# Patient Record
Sex: Male | Born: 1945 | Race: White | Hispanic: No | Marital: Married | Smoking: Never smoker
Health system: Southern US, Community
[De-identification: ages and names within clinical notes are randomized; demographics above are authoritative.]

## PROBLEM LIST (undated history)

## (undated) DIAGNOSIS — E785 Hyperlipidemia, unspecified: Secondary | ICD-10-CM

## (undated) DIAGNOSIS — E119 Type 2 diabetes mellitus without complications: Secondary | ICD-10-CM

## (undated) HISTORY — PX: CARDIAC VALVE REPLACEMENT: SHX585

## (undated) HISTORY — PX: CHOLECYSTECTOMY: SHX55

## (undated) HISTORY — DX: Type 2 diabetes mellitus without complications: E11.9

## (undated) HISTORY — DX: Hyperlipidemia, unspecified: E78.5

---

## 2018-11-27 ENCOUNTER — Ambulatory Visit (INDEPENDENT_AMBULATORY_CARE_PROVIDER_SITE_OTHER): Payer: Medicare PPO | Admitting: Orthopaedic Surgery

## 2018-11-27 ENCOUNTER — Ambulatory Visit (INDEPENDENT_AMBULATORY_CARE_PROVIDER_SITE_OTHER): Payer: Medicare PPO

## 2018-11-27 DIAGNOSIS — M5126 Other intervertebral disc displacement, lumbar region: Secondary | ICD-10-CM | POA: Diagnosis not present

## 2018-11-27 NOTE — Progress Notes (Signed)
Office Visit Note   Patient: Jeremy Stephens           Date of Birth: [birth year]           MRN: [id number] Visit Date: 11/27/2018              Requested by: No referring provider defined for this encounter. PCP: Patient, No Pcp Per   Assessment & Plan: Visit Diagnoses:  1. HNP (herniated nucleus pulposus), lumbar     Plan: We discussed options of microdiscectomy versus epidural injection.  We will set him up for single epidural if this not effective we will proceed with microdiscectomy on the left at the L4-5 level for the extruded fragment.  Pathophysiology discussed.  Follow-Up Instructions: No follow-ups on file.   Orders:  No orders of the defined types were placed in this encounter.  No orders of the defined types were placed in this encounter.     Procedures: No procedures performed   Clinical Data: No additional findings.   Subjective: Chief Complaint  Patient presents with  . Lower Back - Pain    HPI 73 year old male referred by Dr. Layne Benton for a left L4-5 HNP with radiculopathy.  Patient states he has had pain for 8 weeks.  He is having to take Norco to handle the pain.  He is noticed weakness in his leg he is not having trouble walking has not had to use a cane or a walker.  No past history of back problems prior to this recent episode.  He denies fever chills no bowel bladder symptoms he does not have diabetes.  Non-smoker does not drink.  He used to work in [employer] for many years.  MRI scan done in [place] shows extruded disc with fragment 9 x 8 mm left L4-5 with compression.    Review of Systems 14 point review of system positive for previous valve replacement previous MI also stent.  He has some mild lower extremity edema.  He takes aspirin daily is also on Lasix Lipitor for high triglycerides.   Objective: Vital Signs: BP (!) 146/78   Pulse 69   Physical Exam Constitutional:      Appearance: He is well-developed.  HENT:     Head:  Normocephalic and atraumatic.  Eyes:     Pupils: Pupils are equal, round, and reactive to light.  Neck:     Thyroid: No thyromegaly.     Trachea: No tracheal deviation.  Cardiovascular:     Rate and Rhythm: Normal rate.  Pulmonary:     Effort: Pulmonary effort is normal.     Breath sounds: No wheezing.  Abdominal:     General: Bowel sounds are normal.     Palpations: Abdomen is soft.  Skin:    General: Skin is warm and dry.     Capillary Refill: Capillary refill takes less than 2 seconds.  Neurological:     Mental Status: He is alert and oriented to person, place, and time.  Psychiatric:        Behavior: Behavior normal.        Thought Content: Thought content normal.        Judgment: Judgment normal.     Ortho Exam patient has negative straight leg raising 90 degrees.  EHL on the left is significantly weak no weakness on the right anterior tib is one half grade weaker on the left normal on the right gastrocsoleus is strong.  He can toe walk but has problems with his  left foot anterior tib weakness with trying to heel walk on the left normal on the right.  Specialty Comments:  No specialty comments available.  Imaging: MRI scan done 11/20/2018 shows moderate facet arthropathy ligamentum thickening broad-based disc bulge with protrusion laterally into the foramina with left paracentral disc protrusion and extruded fragment inferiorly along the posterior aspect of the L5 vertebral body measuring 10 x 7 mm.  This compresses the exiting left inferior L5 nerve root.  No compression on the opposite right side.   PMFS History: Patient Active Problem List   Diagnosis Date Noted  . HNP (herniated nucleus pulposus), lumbar 11/27/2018   No past medical history on file.  No family history on file.   Social History   Occupational History  . Not on file  Tobacco Use  . Smoking status: Not on file  Substance and Sexual Activity  . Alcohol use: Not on file  . Drug use: Not on file   . Sexual activity: Not on file

## 2018-12-10 ENCOUNTER — Other Ambulatory Visit: Payer: Self-pay | Admitting: Orthopaedic Surgery

## 2018-12-10 ENCOUNTER — Telehealth: Payer: Self-pay | Admitting: Radiology

## 2018-12-10 DIAGNOSIS — M5126 Other intervertebral disc displacement, lumbar region: Secondary | ICD-10-CM

## 2018-12-10 MED ORDER — HYDROCODONE-ACETAMINOPHEN 5-325 MG PO TABS: 1.0000 | ORAL_TABLET | Freq: Two times a day (BID) | ORAL | 0 refills | Status: DC | PRN

## 2018-12-10 NOTE — Telephone Encounter (Signed)
Patient called [place] office and asked for refill of pain medication. Please advise.  Per last office note, patient was taking Norco.

## 2018-12-10 NOTE — Telephone Encounter (Signed)
I sent him in # 20 norco 5/325 one po bid prn pain. This should be last refill unless he gets surgery , [facility] thanks

## 2018-12-11 NOTE — Addendum Note (Signed)
Addended by: Meyer Cory on: 12/11/2018 08:43 AM   Modules accepted: Orders

## 2018-12-11 NOTE — Telephone Encounter (Signed)
I called patient and advised. He states that he continues to have pain  He is aware this will be last refill.

## 2018-12-11 NOTE — Telephone Encounter (Signed)
FYI  Please see below. Referral did not get entered at time of patient's last appt. Son requests as soon as possible.

## 2018-12-11 NOTE — Telephone Encounter (Signed)
I spoke with patient and son. They questioned when ESI would be done. No order entered in chart. I explained that this had not been entered yet, but I would do it now and send to Dr. Romona Curls area to get authorization and get patient scheduled. Son asks that we do this as soon as possible, as they have been waiting for injection. I advised we would do the best that we can. Explained to son that Dr. Lorin Mercy would like to see patient after ESI. Follow up appt already scheduled in [place] office on Sept. 3. Son will cancel that appt if ESI has not been completed.

## 2018-12-11 NOTE — Telephone Encounter (Signed)
Called pt and his wife answered the phone and she wanted to schedule pt appt. Our next appt was 12/26/2018. Advised wife that pt needs to get his 12/20/2018 r/s at the [place] office, pt wife understood.

## 2018-12-20 ENCOUNTER — Ambulatory Visit: Payer: Medicare PPO | Admitting: Orthopaedic Surgery

## 2018-12-26 ENCOUNTER — Ambulatory Visit (INDEPENDENT_AMBULATORY_CARE_PROVIDER_SITE_OTHER): Payer: Medicare PPO | Admitting: Physical Medicine and Rehabilitation

## 2018-12-26 ENCOUNTER — Ambulatory Visit: Payer: Self-pay

## 2018-12-26 VITALS — BP 157/84 | HR 61

## 2018-12-26 DIAGNOSIS — M5116 Intervertebral disc disorders with radiculopathy, lumbar region: Secondary | ICD-10-CM

## 2018-12-26 MED ORDER — BETAMETHASONE SOD PHOS & ACET 6 (3-3) MG/ML IJ SUSP: 12.0000 mg | Freq: Once | INTRAMUSCULAR | Status: DC

## 2018-12-26 NOTE — Progress Notes (Signed)
 .  Numeric Pain Rating Scale and Functional Assessment Average Pain 8   In the last MONTH (on 0-10 scale) has pain interfered with the following?  1. General activity like being  able to carry out your everyday physical activities such as walking, climbing stairs, carrying groceries, or moving a chair?  Rating(7)   +Driver, -BT, -Dye Allergies.  

## 2018-12-26 NOTE — Progress Notes (Signed)
   Renel Klinge - 73 y.o. male MRN [id number]  Date of birth: 1945/11/27  Office Visit Note: Visit Date: 12/26/2018 PCP: Patient, No Pcp Per Referred by: Marybelle Killings, MD  Subjective: Chief Complaint  Patient presents with  . Lower Back - Pain  . 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  He reports 10 weeks of pain but actually over the last couple of weeks or so he is really gotten much better.  He gets some pain with walking standing for a long period.  He has had no focal weakness he has had no red flag complaints.  He has been thoroughly evaluated by Dr. Lorin Mercy.  He does have an appointment coming up for reevaluation after injection.  He has had medication trials and therapy and home exercise and time.  After speaking with him at length today he is actually doing quite well he does not really wish to have an injection if he does not have to.  He reports really at this point he could continue to do his activities if he felt like he is right now.  Brief exam shows he has no focal weakness.  At this point no other complaints I think it is wise just to see how he does without the injection.  If he continues to improve I think the disc herniation is probably resolving to a degree.  If he has any symptoms that flareup he can call us and we would see him back for the injection.  I did describe the injection in detail and we talked about the safety and benefits and reasoning behind those injections.  We will see him back as needed.  ROS Otherwise per HPI.  Assessment & Plan: Visit Diagnoses: No diagnosis found.  Plan: No additional findings.   Meds & Orders: No orders of the defined types were placed in this encounter.  No orders of the defined types were placed in this encounter.   Follow-up: No follow-ups on file.   Procedures: No procedures performed  No notes on  file   Clinical History: No specialty comments available.     Objective:  VS:  HT:    WT:   BMI:     BP:(!) 157/84  HR:61bpm  TEMP: ( )  RESP:  Physical Exam  Ortho Exam Imaging: No results found.

## 2019-01-19 ENCOUNTER — Encounter: Payer: Self-pay | Admitting: Physical Medicine and Rehabilitation

## 2020-07-17 DEATH — deceased

## 2020-10-12 ENCOUNTER — Ambulatory Visit (HOSPITAL_COMMUNITY): Payer: Medicare PPO | Admitting: Hematology

## 2020-10-30 ENCOUNTER — Inpatient Hospital Stay (HOSPITAL_COMMUNITY): Payer: Medicare PPO

## 2020-10-30 ENCOUNTER — Encounter (HOSPITAL_COMMUNITY): Payer: Self-pay | Admitting: Hematology and Oncology

## 2020-10-30 ENCOUNTER — Other Ambulatory Visit: Payer: Self-pay

## 2020-10-30 ENCOUNTER — Inpatient Hospital Stay (HOSPITAL_COMMUNITY): Payer: Medicare PPO | Attending: Hematology | Admitting: Hematology and Oncology

## 2020-10-30 DIAGNOSIS — G629 Polyneuropathy, unspecified: Secondary | ICD-10-CM | POA: Insufficient documentation

## 2020-10-30 DIAGNOSIS — Z8616 Personal history of COVID-19: Secondary | ICD-10-CM | POA: Diagnosis not present

## 2020-10-30 DIAGNOSIS — C9000 Multiple myeloma not having achieved remission: Secondary | ICD-10-CM | POA: Diagnosis present

## 2020-10-30 DIAGNOSIS — D472 Monoclonal gammopathy: Secondary | ICD-10-CM

## 2020-10-30 DIAGNOSIS — N189 Chronic kidney disease, unspecified: Secondary | ICD-10-CM | POA: Diagnosis not present

## 2020-10-30 LAB — COMPREHENSIVE METABOLIC PANEL
ALT: 36 U/L (ref 0–44)
AST: 37 U/L (ref 15–41)
Albumin: 3.8 g/dL (ref 3.5–5.0)
Alkaline Phosphatase: 29 U/L — ABNORMAL LOW (ref 38–126)
Anion gap: 7 (ref 5–15)
BUN: 28 mg/dL — ABNORMAL HIGH (ref 8–23)
CO2: 27 mmol/L (ref 22–32)
Calcium: 9.2 mg/dL (ref 8.9–10.3)
Chloride: 101 mmol/L (ref 98–111)
Creatinine, Ser: 2.33 mg/dL — ABNORMAL HIGH (ref 0.61–1.24)
GFR, Estimated: 28 mL/min — ABNORMAL LOW (ref >60)
Glucose, Bld: 197 mg/dL — ABNORMAL HIGH (ref 70–99)
Potassium: 5.2 mmol/L — ABNORMAL HIGH (ref 3.5–5.1)
Sodium: 135 mmol/L (ref 135–145)
Total Bilirubin: 1.2 mg/dL (ref 0.3–1.2)
Total Protein: 7.3 g/dL (ref 6.5–8.1)

## 2020-10-30 LAB — CBC WITH DIFFERENTIAL/PLATELET
Abs Immature Granulocytes: 0.03 10*3/uL (ref 0.00–0.07)
Basophils Absolute: 0 10*3/uL (ref 0.0–0.1)
Basophils Relative: 1 %
Eosinophils Absolute: 0.3 10*3/uL (ref 0.0–0.5)
Eosinophils Relative: 4 %
HCT: 34.6 % — ABNORMAL LOW (ref 39.0–52.0)
Hemoglobin: 11.5 g/dL — ABNORMAL LOW (ref 13.0–17.0)
Immature Granulocytes: 1 %
Lymphocytes Relative: 40 %
Lymphs Abs: 2.5 10*3/uL (ref 0.7–4.0)
MCH: 36.5 pg — ABNORMAL HIGH (ref 26.0–34.0)
MCHC: 33.2 g/dL (ref 30.0–36.0)
MCV: 109.8 fL — ABNORMAL HIGH (ref 80.0–100.0)
Monocytes Absolute: 0.4 10*3/uL (ref 0.1–1.0)
Monocytes Relative: 6 %
Neutro Abs: 3.1 10*3/uL (ref 1.7–7.7)
Neutrophils Relative %: 48 %
Platelets: 169 10*3/uL (ref 150–400)
RBC: 3.15 MIL/uL — ABNORMAL LOW (ref 4.22–5.81)
RDW: 14.1 % (ref 11.5–15.5)
WBC: 6.4 10*3/uL (ref 4.0–10.5)
nRBC: 0 % (ref 0.0–0.2)

## 2020-10-30 NOTE — Progress Notes (Signed)
[facility] CONSULT NOTE  Patient Care Team: Patient, No Pcp Per (Inactive) as PCP - General (General Practice)  CHIEF COMPLAINTS/PURPOSE OF CONSULTATION:  Smoldering MM  ASSESSMENT & PLAN:   Orders Placed This Encounter  Procedures   CBC with Differential/Platelet    Standing Status:   Standing    Number of Occurrences:   22    Standing Expiration Date:   10/30/2021   Comprehensive metabolic panel    Standing Status:   Standing    Number of Occurrences:   33    Standing Expiration Date:   10/30/2021   SPEP (Serum protein electrophoresis)    Standing Status:   Future    Number of Occurrences:   1    Standing Expiration Date:   10/30/2021   Kappa/lambda light chains    Standing Status:   Future    Number of Occurrences:   1    Standing Expiration Date:   10/30/2021   IgG, IgA, IgM    Standing Status:   Future    Number of Occurrences:   1    Standing Expiration Date:   10/30/2021   Beta 2 microglobulin, serum    Standing Status:   Future    Number of Occurrences:   1    Standing Expiration Date:   10/30/2021   This 75 year old male patient with past medical history significant for 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 He serum protein electrophoresis showed a monoclonal spike of 0.4 g/dL, kappa lambda ratio varied from 3.55-4.38. He hemoglobin has ranged from 11.5 to approximately 12 g.  Rest of his white blood cell count and platelet count were normal.  His baseline creatinine is anywhere from 1.9-2.1 He had bone marrow biopsy which showed 20% plasma cells He does meet criteria for smoldering multiple myeloma given more than 10% plasma cells, according to his hematologist in [place], his neuropathy not chronic kidney disease were thought to be related to the multiple myeloma. He also had PET/CT which showed no evidence of bone lesions. Given  intermediate risk smoldering multiple myeloma, he was not recommended to start Revlimid.  We will repeat labs today.  If he continues to have similar labs without any evidence of progression, he will continue every 2 to 3 months follow-up with Dr. Delton Coombes with repeat labs.  Labs from today with baseline Hb, slight increase in creatinine, rest of the labs pending.  HISTORY OF PRESENTING ILLNESS:   Nycholas Rayner 75 y.o. 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 01/22/2020 serum protein electrophoresis showed Tri clonal peaks. 02/14/2020 free light chain ratio was 4.38. November 2021 bone marrow biopsy showed 20% plasma cells, FISH negative and cytogenetics negative. 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  Heterogeneous soft tissue mass along right gluteal cleft at level of coccyx up to 3.1 cm SUV max of 3.27.  Does not meet criteria for active myeloma by either bone marrow plasma cells or free light chain ratio and does not clearly meet crab criteria.  He has had a chronic mild anemia which has been stable and is above cutoff for active myeloma.  He has had recent worsening of  his CKD which again was thought to be related to losartan.  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  So he was diagnosed with smoldering myeloma and meets only 1 out of 3 criteria, not candidate for early treatment.  Hence close observation was recommended.  He was continued at [facility] with monthly myeloma labs.  He is transferring care given proximity of [place] to where he lives.  He is very hard of hearing, son present with him at bedside.  He denies any new health  complaints, he feels well overall, is definitely scared of transformation to multiple myeloma.  He has neuropathy since he had acute COVID and hospitalization resulting from it for almost 4 months.  Neuropathy has continued to get better since its original onset but this is probably his most bothersome complaint.  Rest of the pertinent 10 point ROS reviewed and negative.  MEDICAL HISTORY:  Past Medical History:  Diagnosis Date   Diabetes mellitus without complication ([place])    Hyperlipidemia     SURGICAL HISTORY: Past Surgical History:  Procedure Laterality Date   CARDIAC VALVE REPLACEMENT     [place]   CHOLECYSTECTOMY     [place]    SOCIAL HISTORY: Social History   Socioeconomic History   Marital status: Married    Spouse name: Not on file   Number of children: Not on file   Years of education: Not on file   Highest education level: Not on file  Occupational History   Occupation: retired  Tobacco Use   Smoking status: Never   Smokeless tobacco: Never  Vaping Use   Vaping Use: Never used  Substance and Sexual Activity   Alcohol use: Not Currently   Drug use: Not Currently   Sexual activity: Not Currently  Other Topics Concern   Not on file  Social History Narrative   Not on file   Social Determinants of Health   Financial Resource Strain: Not on file  Food Insecurity: Not on file  Transportation Needs: Not on file  Physical Activity: Not on file  Stress: Not on file  Social Connections: Not on file  Intimate Partner Violence: Not on file    FAMILY HISTORY: Family History  Problem Relation Age of Onset   Multiple myeloma Mother 68   Heart attack Father 75       died of heart attack   Heart attack Brother    COPD Brother    Stomach cancer Paternal Uncle        1970    ALLERGIES:  is allergic to niaspan [niacin er] and sulfa antibiotics.  MEDICATIONS:  Current Outpatient Medications  Medication Sig Dispense Refill   aspirin 81 MG EC tablet Take  81 mg by mouth daily.     atorvastatin (LIPITOR) 40 MG tablet  See Instructions, TAKE ONE TABLET BY MOUTH AT BEDTIME, # 90 tab, 1 Refill(s), Pharmacy: [facility]     Coenzyme Q10 100 MG capsule Take 100 mg by mouth daily.     fenofibrate 160 MG tablet Take 160 mg by mouth daily.     ferrous sulfate 325 (65 FE) MG tablet Take 325 mg by mouth 2 (two) times daily.     furosemide (LASIX) 40 MG tablet Take 40 mg by mouth 2 (two) times daily.     gabapentin (NEURONTIN) 300 MG capsule Take 300 mg by mouth 3 (three) times daily.     glipiZIDE (GLUCOTROL XL) 5 MG  24 hr tablet Take 5 mg by mouth in the morning.     Krill Oil 500 MG CAPS Take 1 tablet by mouth daily.     losartan (COZAAR) 50 MG tablet Take 50 mg by mouth at bedtime.     metoprolol succinate (TOPROL-XL) 25 MG 24 hr tablet Take 25 mg by mouth 2 (two) times daily.     mirtazapine (REMERON) 15 MG tablet  See Instructions, TAKE ONE TABLET BY MOUTH ONCE A DAY AT BEDTIME TO HELP WITH SLEEP AND MOOD, # 90 tab, 1 Refill(s), Pharmacy: [facility]     No current facility-administered medications for this visit.     PHYSICAL EXAMINATION: ECOG PERFORMANCE STATUS: 1 - Symptomatic but completely ambulatory  There were no vitals taken for this visit.  GENERAL:alert, no distress and comfortable, using assistive device. NECK: supple, thyroid normal size, non-tender, without nodularity LYMPH:  no palpable lymphadenopathy in the cervical, axillary LUNGS: clear to auscultation and percussion with normal breathing effort HEART: regular rate & rhythm and no murmurs and no lower extremity edema ABDOMEN:abdomen soft, non-tender  Musculoskeletal:no cyanosis of digits and no clubbing  PSYCH: alert & oriented x 3 with fluent speech NEURO: no focal motor/sensory deficits  LABORATORY DATA:  I have reviewed the data as listed Lab Results  Component Value Date   WBC 6.4 10/30/2020   HGB 11.5 (L) 10/30/2020   HCT 34.6 (L) 10/30/2020   MCV  109.8 (H) 10/30/2020   PLT 169 10/30/2020     Chemistry      Component Value Date/Time   NA 135 10/30/2020 1219   K 5.2 (H) 10/30/2020 1219   CL 101 10/30/2020 1219   CO2 27 10/30/2020 1219   BUN 28 (H) 10/30/2020 1219   CREATININE 2.33 (H) 10/30/2020 1219      Component Value Date/Time   CALCIUM 9.2 10/30/2020 1219   ALKPHOS 29 (L) 10/30/2020 1219   AST 37 10/30/2020 1219   ALT 36 10/30/2020 1219   BILITOT 1.2 10/30/2020 1219      RADIOGRAPHIC STUDIES: I have personally reviewed the radiological images as listed and agreed with the findings in the report. No results found.  All questions were answered. The patient knows to call the clinic with any problems, questions or concerns. I spent 60 minutes in the care of this patient including H and P, review of records, counseling and coordination of care. Reviewed outside medical records, H and P, counseled about FU recommendations for SMM, briefly discussed transformation risk to MM and multiple treatment which are currently available for management of MM.    Benay Pike, MD 10/30/2020 3:05 PM

## 2020-10-31 LAB — BETA 2 MICROGLOBULIN, SERUM: Beta-2 Microglobulin: 5.8 mg/L — ABNORMAL HIGH (ref 0.6–2.4)

## 2020-10-31 LAB — IGG, IGA, IGM
IgA: 1040 mg/dL — ABNORMAL HIGH (ref 61–437)
IgG (Immunoglobin G), Serum: 633 mg/dL (ref 603–1613)
IgM (Immunoglobulin M), Srm: 16 mg/dL (ref 15–143)

## 2020-11-02 ENCOUNTER — Other Ambulatory Visit (HOSPITAL_COMMUNITY): Payer: Self-pay

## 2020-11-02 DIAGNOSIS — D472 Monoclonal gammopathy: Secondary | ICD-10-CM

## 2020-11-02 DIAGNOSIS — C9000 Multiple myeloma not having achieved remission: Secondary | ICD-10-CM

## 2020-11-02 LAB — PROTEIN ELECTROPHORESIS, SERUM
A/G Ratio: 1.3 (ref 0.7–1.7)
Albumin ELP: 4.2 g/dL (ref 2.9–4.4)
Alpha-1-Globulin: 0.2 g/dL (ref 0.0–0.4)
Alpha-2-Globulin: 0.8 g/dL (ref 0.4–1.0)
Beta Globulin: 1 g/dL (ref 0.7–1.3)
Gamma Globulin: 1.3 g/dL (ref 0.4–1.8)
Globulin, Total: 3.3 g/dL (ref 2.2–3.9)
M-Spike, %: 0.5 g/dL — ABNORMAL HIGH
Total Protein ELP: 7.5 g/dL (ref 6.0–8.5)

## 2020-11-02 LAB — KAPPA/LAMBDA LIGHT CHAINS
Kappa free light chain: 65.5 mg/L — ABNORMAL HIGH (ref 3.3–19.4)
Kappa, lambda light chain ratio: 4.78 — ABNORMAL HIGH (ref 0.26–1.65)
Lambda free light chains: 13.7 mg/L (ref 5.7–26.3)

## 2021-01-04 ENCOUNTER — Inpatient Hospital Stay (HOSPITAL_COMMUNITY): Payer: Medicare PPO | Attending: Hematology

## 2021-01-04 ENCOUNTER — Other Ambulatory Visit: Payer: Self-pay

## 2021-01-04 DIAGNOSIS — C9000 Multiple myeloma not having achieved remission: Secondary | ICD-10-CM | POA: Insufficient documentation

## 2021-01-04 DIAGNOSIS — Z801 Family history of malignant neoplasm of trachea, bronchus and lung: Secondary | ICD-10-CM | POA: Diagnosis not present

## 2021-01-04 DIAGNOSIS — Z8616 Personal history of COVID-19: Secondary | ICD-10-CM | POA: Insufficient documentation

## 2021-01-04 DIAGNOSIS — D472 Monoclonal gammopathy: Secondary | ICD-10-CM

## 2021-01-04 DIAGNOSIS — Z806 Family history of leukemia: Secondary | ICD-10-CM | POA: Diagnosis not present

## 2021-01-04 DIAGNOSIS — Z800 Family history of malignant neoplasm of digestive organs: Secondary | ICD-10-CM | POA: Diagnosis not present

## 2021-01-04 LAB — CBC WITH DIFFERENTIAL/PLATELET
Abs Immature Granulocytes: 0.02 10*3/uL (ref 0.00–0.07)
Basophils Absolute: 0 10*3/uL (ref 0.0–0.1)
Basophils Relative: 1 %
Eosinophils Absolute: 0.2 10*3/uL (ref 0.0–0.5)
Eosinophils Relative: 4 %
HCT: 34.7 % — ABNORMAL LOW (ref 39.0–52.0)
Hemoglobin: 11.5 g/dL — ABNORMAL LOW (ref 13.0–17.0)
Immature Granulocytes: 0 %
Lymphocytes Relative: 39 %
Lymphs Abs: 2.1 10*3/uL (ref 0.7–4.0)
MCH: 36.1 pg — ABNORMAL HIGH (ref 26.0–34.0)
MCHC: 33.1 g/dL (ref 30.0–36.0)
MCV: 108.8 fL — ABNORMAL HIGH (ref 80.0–100.0)
Monocytes Absolute: 0.3 10*3/uL (ref 0.1–1.0)
Monocytes Relative: 6 %
Neutro Abs: 2.8 10*3/uL (ref 1.7–7.7)
Neutrophils Relative %: 50 %
Platelets: 165 10*3/uL (ref 150–400)
RBC: 3.19 MIL/uL — ABNORMAL LOW (ref 4.22–5.81)
RDW: 13.9 % (ref 11.5–15.5)
WBC: 5.4 10*3/uL (ref 4.0–10.5)
nRBC: 0 % (ref 0.0–0.2)

## 2021-01-04 LAB — COMPREHENSIVE METABOLIC PANEL
ALT: 34 U/L (ref 0–44)
AST: 33 U/L (ref 15–41)
Albumin: 3.9 g/dL (ref 3.5–5.0)
Alkaline Phosphatase: 37 U/L — ABNORMAL LOW (ref 38–126)
Anion gap: 8 (ref 5–15)
BUN: 21 mg/dL (ref 8–23)
CO2: 29 mmol/L (ref 22–32)
Calcium: 9 mg/dL (ref 8.9–10.3)
Chloride: 99 mmol/L (ref 98–111)
Creatinine, Ser: 1.94 mg/dL — ABNORMAL HIGH (ref 0.61–1.24)
GFR, Estimated: 35 mL/min — ABNORMAL LOW (ref >60)
Glucose, Bld: 278 mg/dL — ABNORMAL HIGH (ref 70–99)
Potassium: 4.5 mmol/L (ref 3.5–5.1)
Sodium: 136 mmol/L (ref 135–145)
Total Bilirubin: 0.9 mg/dL (ref 0.3–1.2)
Total Protein: 7.1 g/dL (ref 6.5–8.1)

## 2021-01-04 LAB — LACTATE DEHYDROGENASE: LDH: 154 U/L (ref 98–192)

## 2021-01-05 LAB — KAPPA/LAMBDA LIGHT CHAINS
Kappa free light chain: 60.4 mg/L — ABNORMAL HIGH (ref 3.3–19.4)
Kappa, lambda light chain ratio: 3.55 — ABNORMAL HIGH (ref 0.26–1.65)
Lambda free light chains: 17 mg/L (ref 5.7–26.3)

## 2021-01-05 LAB — PROTEIN ELECTROPHORESIS, SERUM
A/G Ratio: 1.1 (ref 0.7–1.7)
Albumin ELP: 3.5 g/dL (ref 2.9–4.4)
Alpha-1-Globulin: 0.2 g/dL (ref 0.0–0.4)
Alpha-2-Globulin: 0.8 g/dL (ref 0.4–1.0)
Beta Globulin: 1 g/dL (ref 0.7–1.3)
Gamma Globulin: 1.2 g/dL (ref 0.4–1.8)
Globulin, Total: 3.3 g/dL (ref 2.2–3.9)
M-Spike, %: 0.3 g/dL — ABNORMAL HIGH
Total Protein ELP: 6.8 g/dL (ref 6.0–8.5)

## 2021-01-10 NOTE — Progress Notes (Signed)
Jeremy Stephens, [place] [address]   CLINIC:  Medical Oncology/Hematology  PCP:  Patient, No Pcp Per (Inactive) None None   REASON FOR VISIT:  Follow-up for IgA kappa smoldering multiple myeloma  PRIOR THERAPY: none  NGS Results: not done  CURRENT THERAPY: surveillance  INTERVAL HISTORY:  [name], a 75 y.o. male, returns for routine follow-up of his IgA kappa smoldering multiple myeloma. Jeremy Stephens was last seen on 10/30/2020 with Dr. Benay Pike.   Today he reports feeling well. He lives in [place], [place]. He had a Covid infection in starting November 2020 for which he was hospitalized for 4 months. Following this infection he reports continued tingling in his feet bilaterally. His mother had multiple myeloma, and his brother had lung cancer and was a smoker. A paternal uncle had stomach cancer and chewed tobacco. Prior to retirement he worked in [employer], and he denies excessive or unusual chemical exposure.    REVIEW OF SYSTEMS:  Review of Systems  Constitutional:  Positive for fatigue (40%). Negative for appetite change.  Neurological:  Positive for numbness (tingling feet).  All other systems reviewed and are negative.  PAST MEDICAL/SURGICAL HISTORY:  Past Medical History:  Diagnosis Date   Diabetes mellitus without complication ([place])    Hyperlipidemia    Past Surgical History:  Procedure Laterality Date   CARDIAC VALVE REPLACEMENT     [place]   CHOLECYSTECTOMY     [place]    SOCIAL HISTORY:  Social History   Socioeconomic History   Marital status: Married    Spouse name: Not on file   Number of children: Not on file   Years of education: Not on file   Highest education level: Not on file  Occupational History   Occupation: retired  Tobacco Use   Smoking status: Never   Smokeless tobacco: Never  Vaping Use   Vaping Use: Never used  Substance and Sexual Activity   Alcohol use: Not Currently    Drug use: Not Currently   Sexual activity: Not Currently  Other Topics Concern   Not on file  Social History Narrative   Not on file   Social Determinants of Health   Financial Resource Strain: Not on file  Food Insecurity: Not on file  Transportation Needs: Not on file  Physical Activity: Not on file  Stress: Not on file  Social Connections: Not on file  Intimate Partner Violence: Not on file    FAMILY HISTORY:  Family History  Problem Relation Age of Onset   Multiple myeloma Mother 24   Heart attack Father 61       died of heart attack   Heart attack Brother    COPD Brother    Stomach cancer Paternal Uncle        1970    CURRENT MEDICATIONS:  Current Outpatient Medications  Medication Sig Dispense Refill   aspirin 81 MG EC tablet Take 81 mg by mouth daily.     atorvastatin (LIPITOR) 40 MG tablet  See Instructions, TAKE ONE TABLET BY MOUTH AT BEDTIME, # 90 tab, 1 Refill(s), Pharmacy: [facility]     Coenzyme Q10 100 MG capsule Take 100 mg by mouth daily.     fenofibrate 160 MG tablet Take 160 mg by mouth daily.     ferrous sulfate 325 (65 FE) MG tablet Take 325 mg by mouth 2 (two) times daily.     furosemide (LASIX) 40 MG tablet Take 40 mg by  mouth 2 (two) times daily.     gabapentin (NEURONTIN) 300 MG capsule Take 300 mg by mouth 3 (three) times daily.     glipiZIDE (GLUCOTROL XL) 5 MG 24 hr tablet Take 5 mg by mouth in the morning.     Krill Oil 500 MG CAPS Take 1 tablet by mouth daily.     losartan (COZAAR) 50 MG tablet Take 50 mg by mouth at bedtime.     metoprolol succinate (TOPROL-XL) 25 MG 24 hr tablet Take 25 mg by mouth 2 (two) times daily.     mirtazapine (REMERON) 15 MG tablet  See Instructions, TAKE ONE TABLET BY MOUTH ONCE A DAY AT BEDTIME TO HELP WITH SLEEP AND MOOD, # 90 tab, 1 Refill(s), Pharmacy: [facility]     No current facility-administered medications for this visit.    ALLERGIES:  Allergies  Allergen Reactions   Niaspan  [Niacin Er]    Sulfa Antibiotics     PHYSICAL EXAM:  Performance status (ECOG): 1 - Symptomatic but completely ambulatory  There were no vitals filed for this visit. Wt Readings from Last 3 Encounters:  No data found for Wt   Physical Exam Vitals reviewed.  Constitutional:      Appearance: Normal appearance.  Cardiovascular:     Rate and Rhythm: Normal rate and regular rhythm.     Pulses: Normal pulses.     Heart sounds: Normal heart sounds.  Pulmonary:     Effort: Pulmonary effort is normal.     Breath sounds: Normal breath sounds.  Neurological:     General: No focal deficit present.     Mental Status: He is alert and oriented to person, place, and time.  Psychiatric:        Mood and Affect: Mood normal.        Behavior: Behavior normal.     LABORATORY DATA:  I have reviewed the labs as listed.  CBC Latest Ref Rng & Units 01/04/2021 10/30/2020  WBC 4.0 - 10.5 K/uL 5.4 6.4  Hemoglobin 13.0 - 17.0 g/dL 11.5(L) 11.5(L)  Hematocrit 39.0 - 52.0 % 34.7(L) 34.6(L)  Platelets 150 - 400 K/uL 165 169   CMP Latest Ref Rng & Units 01/04/2021 10/30/2020  Glucose 70 - 99 mg/dL 278(H) 197(H)  BUN 8 - 23 mg/dL 21 28(H)  Creatinine 0.61 - 1.24 mg/dL 1.94(H) 2.33(H)  Sodium 135 - 145 mmol/L 136 135  Potassium 3.5 - 5.1 mmol/L 4.5 5.2(H)  Chloride 98 - 111 mmol/L 99 101  CO2 22 - 32 mmol/L 29 27  Calcium 8.9 - 10.3 mg/dL 9.0 9.2  Total Protein 6.5 - 8.1 g/dL 7.1 7.3  Total Bilirubin 0.3 - 1.2 mg/dL 0.9 1.2  Alkaline Phos 38 - 126 U/L 37(L) 29(L)  AST 15 - 41 U/L 33 37  ALT 0 - 44 U/L 34 36    DIAGNOSTIC IMAGING:  I have independently reviewed the scans and discussed with the patient. No results found.   ASSESSMENT:  IgA kappa smoldering multiple myeloma: - Diagnosed in November 2021 at [facility] hematology oncology. - Bone marrow biopsy with 20% plasma cells.  PET scan was also negative for any lytic lesions. - He had COVID infection in November 2020.  Since then he has been  using walker.  2.  Social/family history: - He worked in [employer] and no exposure to chemicals was reported. - Mother died of multiple myeloma.  Brother had lung cancer.  Paternal uncle had stomach cancer.   PLAN:  IgA  kappa smoldering multiple myeloma: - He does not report any new onset pains.  No recent infections. - Reviewed labs from 01/04/2021.  Creatinine is 1.94 and calcium is 9.0. - SPEP showed M spike of 0.3 g.  Hemoglobin is 11.5 with MCV of 108. - Free kappa light chains are elevated at 60, lambda light chains 17, ratio increased at 3.55. - We discussed normal pathophysiology and follow-ups of smoldering myeloma. - Recommend follow-up in 4 months with repeat labs.  We will also consider intermittent skeletal survey.   Orders placed this encounter:  No orders of the defined types were placed in this encounter.    Derek Jack, MD [place] [phone]   I, Thana Ates, am acting as a scribe for Dr. Derek Jack.  I, Derek Jack MD, have reviewed the above documentation for accuracy and completeness, and I agree with the above.

## 2021-01-11 ENCOUNTER — Other Ambulatory Visit: Payer: Self-pay

## 2021-01-11 ENCOUNTER — Inpatient Hospital Stay (HOSPITAL_COMMUNITY): Payer: Medicare PPO | Admitting: Hematology

## 2021-01-11 VITALS — BP 123/65 | HR 83 | Temp 97.0°F | Resp 21 | Wt 221.4 lb

## 2021-01-11 DIAGNOSIS — D472 Monoclonal gammopathy: Secondary | ICD-10-CM

## 2021-01-11 DIAGNOSIS — C9000 Multiple myeloma not having achieved remission: Secondary | ICD-10-CM

## 2021-01-11 NOTE — Patient Instructions (Addendum)
[facility] °Discharge Instructions ° °You were seen today by Dr. Katragadda. He went over your recent results. Dr. Katragadda will see you back in 4 months for labs and follow up. ° ° °Thank you for choosing [facility] at [facility] to provide your oncology and hematology care.  To afford each patient quality time with our provider, please arrive at least 15 minutes before your scheduled appointment time.  ° °If you have a lab appointment with the Cancer Center please come in thru the Main Entrance and check in at the main information desk ° °You need to re-schedule your appointment should you arrive 10 or more minutes late.  We strive to give you quality time with our providers, and arriving late affects you and other patients whose appointments are after yours.  Also, if you no show three or more times for appointments you may be dismissed from the clinic at the providers discretion.     °Again, thank you for choosing [facility].  Our hope is that these requests will decrease the amount of time that you wait before being seen by our physicians.       °_____________________________________________________________ ° °Should you have questions after your visit to [facility], please contact our office at [phone] between the hours of 8:00 a.m. and 4:30 p.m.  Voicemails left after 4:00 p.m. will not be returned until the following business day.  For prescription refill requests, have your pharmacy contact our office and allow 72 hours.   ° °Cancer Center Support Programs:  ° °> Cancer Support Group  °2nd Tuesday of the month 1pm-2pm, Journey Room  ° ° °

## 2021-03-19 ENCOUNTER — Emergency Department (HOSPITAL_COMMUNITY): Payer: Medicare PPO

## 2021-03-19 ENCOUNTER — Emergency Department (HOSPITAL_COMMUNITY)
Admission: EM | Admit: 2021-03-19 | Discharge: 2021-03-19 | Disposition: A | Discharge status: Home / Self Care | Payer: Medicare PPO | Attending: Emergency Medicine | Admitting: Emergency Medicine

## 2021-03-19 ENCOUNTER — Encounter (HOSPITAL_COMMUNITY): Payer: Self-pay | Admitting: *Deleted

## 2021-03-19 DIAGNOSIS — Z7982 Long term (current) use of aspirin: Secondary | ICD-10-CM | POA: Insufficient documentation

## 2021-03-19 DIAGNOSIS — G8929 Other chronic pain: Secondary | ICD-10-CM | POA: Insufficient documentation

## 2021-03-19 DIAGNOSIS — Z954 Presence of other heart-valve replacement: Secondary | ICD-10-CM | POA: Insufficient documentation

## 2021-03-19 DIAGNOSIS — R944 Abnormal results of kidney function studies: Secondary | ICD-10-CM | POA: Insufficient documentation

## 2021-03-19 DIAGNOSIS — Z8579 Personal history of other malignant neoplasms of lymphoid, hematopoietic and related tissues: Secondary | ICD-10-CM | POA: Insufficient documentation

## 2021-03-19 DIAGNOSIS — Z79899 Other long term (current) drug therapy: Secondary | ICD-10-CM | POA: Insufficient documentation

## 2021-03-19 DIAGNOSIS — M5442 Lumbago with sciatica, left side: Secondary | ICD-10-CM | POA: Diagnosis not present

## 2021-03-19 DIAGNOSIS — E119 Type 2 diabetes mellitus without complications: Secondary | ICD-10-CM | POA: Diagnosis not present

## 2021-03-19 DIAGNOSIS — M545 Low back pain, unspecified: Secondary | ICD-10-CM | POA: Diagnosis present

## 2021-03-19 LAB — BASIC METABOLIC PANEL
Anion gap: 11 (ref 5–15)
BUN: 67 mg/dL — ABNORMAL HIGH (ref 8–23)
CO2: 26 mmol/L (ref 22–32)
Calcium: 9 mg/dL (ref 8.9–10.3)
Chloride: 100 mmol/L (ref 98–111)
Creatinine, Ser: 3.1 mg/dL — ABNORMAL HIGH (ref 0.61–1.24)
GFR, Estimated: 20 mL/min — ABNORMAL LOW (ref >60)
Glucose, Bld: 282 mg/dL — ABNORMAL HIGH (ref 70–99)
Potassium: 4.9 mmol/L (ref 3.5–5.1)
Sodium: 137 mmol/L (ref 135–145)

## 2021-03-19 LAB — URINALYSIS, ROUTINE W REFLEX MICROSCOPIC
Bilirubin Urine: NEGATIVE
Glucose, UA: 100 mg/dL — AB
Hgb urine dipstick: NEGATIVE
Ketones, ur: NEGATIVE mg/dL
Leukocytes,Ua: NEGATIVE
Nitrite: NEGATIVE
Protein, ur: NEGATIVE mg/dL
Specific Gravity, Urine: 1.015 (ref 1.005–1.030)
pH: 5.5 (ref 5.0–8.0)

## 2021-03-19 LAB — I-STAT CHEM 8, ED
BUN: 46 mg/dL — ABNORMAL HIGH (ref 8–23)
Calcium, Ion: 0.96 mmol/L — ABNORMAL LOW (ref 1.15–1.40)
Chloride: 111 mmol/L (ref 98–111)
Creatinine, Ser: 2.3 mg/dL — ABNORMAL HIGH (ref 0.61–1.24)
Glucose, Bld: 164 mg/dL — ABNORMAL HIGH (ref 70–99)
HCT: 28 % — ABNORMAL LOW (ref 39.0–52.0)
Hemoglobin: 9.5 g/dL — ABNORMAL LOW (ref 13.0–17.0)
Potassium: 3.4 mmol/L — ABNORMAL LOW (ref 3.5–5.1)
Sodium: 144 mmol/L (ref 135–145)
TCO2: 20 mmol/L — ABNORMAL LOW (ref 22–32)

## 2021-03-19 LAB — CBC WITH DIFFERENTIAL/PLATELET
Abs Immature Granulocytes: 0.05 10*3/uL (ref 0.00–0.07)
Basophils Absolute: 0 10*3/uL (ref 0.0–0.1)
Basophils Relative: 0 %
Eosinophils Absolute: 0 10*3/uL (ref 0.0–0.5)
Eosinophils Relative: 0 %
HCT: 37.4 % — ABNORMAL LOW (ref 39.0–52.0)
Hemoglobin: 12.1 g/dL — ABNORMAL LOW (ref 13.0–17.0)
Immature Granulocytes: 1 %
Lymphocytes Relative: 21 %
Lymphs Abs: 1.2 10*3/uL (ref 0.7–4.0)
MCH: 35 pg — ABNORMAL HIGH (ref 26.0–34.0)
MCHC: 32.4 g/dL (ref 30.0–36.0)
MCV: 108.1 fL — ABNORMAL HIGH (ref 80.0–100.0)
Monocytes Absolute: 0.2 10*3/uL (ref 0.1–1.0)
Monocytes Relative: 3 %
Neutro Abs: 4.4 10*3/uL (ref 1.7–7.7)
Neutrophils Relative %: 75 %
Platelets: 171 10*3/uL (ref 150–400)
RBC: 3.46 MIL/uL — ABNORMAL LOW (ref 4.22–5.81)
RDW: 14.2 % (ref 11.5–15.5)
WBC: 5.9 10*3/uL (ref 4.0–10.5)
nRBC: 0 % (ref 0.0–0.2)

## 2021-03-19 MED ORDER — HYDROCODONE-ACETAMINOPHEN 5-325 MG PO TABS
1.0000 | ORAL_TABLET | Freq: Once | ORAL | Status: AC
  Administered 2021-03-19: 1 via ORAL
  Filled 2021-03-19: qty 1

## 2021-03-19 MED ORDER — SODIUM CHLORIDE 0.9 % IV BOLUS
500.0000 mL | Freq: Once | INTRAVENOUS | Status: AC
  Administered 2021-03-19: 500 mL via INTRAVENOUS

## 2021-03-19 MED ORDER — FENTANYL CITRATE PF 50 MCG/ML IJ SOSY: 25.0000 ug | PREFILLED_SYRINGE | Freq: Once | INTRAMUSCULAR | Status: DC

## 2021-03-19 NOTE — Discharge Instructions (Addendum)
Your MRI today shows that you have a herniated disc in your lower back.  This is the likely source of your back and leg pain.  I recommend that you continue taking your prednisone as directed.  If your oxycodone is too sedating, you may try taking 1/2 tablet every 6 hours.  Follow-up with your orthopedic provider or with Dr. Romona Curls office.

## 2021-03-19 NOTE — ED Provider Notes (Signed)
[facility] EMERGENCY DEPARTMENT Provider Note   CSN: [id number] Arrival date & time: 03/19/21  1048     History Chief Complaint  Patient presents with   Back Pain    Jeremy Stephens is a 75 y.o. male.   Back Pain Associated symptoms: no abdominal pain, no dysuria, no fever, no headaches, no numbness and no weakness        Jeremy Stephens is a 75 y.o. male with past medical history for hyperlipidemia and type 2 diabetes who presents to the Emergency Department complaining of pain of his left lower back for greater than 1 week.  Patient reports history of sciatica.  Has been seeing orthopedics in [place].  Was recently switched from hydrocodone to oxycodone for pain.  Patient's son who is also at bedside and provides some history.  Son states that his father has been somewhat confused since starting the oxycodone.  Patient describes constant throbbing pain from his left back and buttock that radiates into his left leg.  Pain is associated with standing and walking.  Pain improves at rest.  Patient's sons states that he had similar symptoms in the past and was referred to orthopedics for a epidural but pain improved and patient canceled the appointment.  Patient states current pain is similar to previous, but more severe.  He denies recent fall or injury, fever or chills, abdominal pain, urine or bowel changes, numbness or weakness of the lower extremities.    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    Past Surgical History:  Procedure Laterality Date   CARDIAC VALVE REPLACEMENT     [place]   CHOLECYSTECTOMY     [place]       Family History  Problem Relation Age of Onset   Multiple myeloma Mother 43   Heart attack Father 61       died of heart attack    Heart attack Brother    COPD Brother    Stomach cancer Paternal Uncle        1970    Social History   Tobacco Use   Smoking status: Never   Smokeless tobacco: Never  Vaping Use   Vaping Use: Never used  Substance Use Topics   Alcohol use: Not Currently   Drug use: Not Currently    Home Medications Prior to Admission medications   Medication Sig Start Date End Date Taking? Authorizing Provider  aspirin 81 MG EC tablet Take 81 mg by mouth daily. 07/11/19  Yes [provider]  atorvastatin (LIPITOR) 40 MG tablet  See Instructions, TAKE ONE TABLET BY MOUTH AT BEDTIME, # 90 tab, 1 Refill(s), Pharmacy: [facility] 12/05/19  Yes [provider]  Cholecalciferol (VITAMIN D3) 1.25 MG (50000 UT) CAPS Take 1 capsule by mouth once a week. Takes on Saturday 02/20/21  Yes [provider]  Coenzyme Q10 100 MG capsule Take 100 mg by mouth daily. 12/13/19  Yes [provider]  fenofibrate 160 MG tablet Take 160 mg by mouth daily. 11/01/18  Yes [provider]  ferrous sulfate 325 (65 FE) MG tablet Take 325 mg by mouth 2 (two) times daily. 12/13/19  Yes [provider]  furosemide (LASIX) 40 MG tablet Take 40 mg by mouth 2 (two) times daily. 10/17/19  Yes [provider]  gabapentin (NEURONTIN) 300 MG capsule Take 300 mg by mouth 3 (three) times daily. 11/19/18  Yes [provider]  glipiZIDE (GLUCOTROL XL) 5 MG 24 hr tablet Take 5 mg by mouth 2 (two) times daily with a meal. 10/18/19  Yes [provider]  Javier Docker Oil 500 MG CAPS Take 1 tablet by mouth daily.   Yes [provider]  losartan (COZAAR) 50 MG tablet Take 50 mg by mouth at bedtime. 03/03/20  Yes [provider]  metoprolol succinate (TOPROL-XL) 25 MG 24 hr tablet Take 25 mg by mouth 2 (two) times daily. 12/18/19  Yes [provider]  mirtazapine (REMERON) 15 MG tablet  See Instructions, TAKE ONE TABLET BY MOUTH ONCE A DAY AT BEDTIME TO HELP  WITH SLEEP AND MOOD, # 90 tab, 1 Refill(s), Pharmacy: [facility] 12/05/19  Yes [provider]  Multiple Vitamin (MULTIVITAMIN WITH MINERALS) TABS tablet Take 1 tablet by mouth daily.   Yes [provider]  oxyCODONE-acetaminophen (PERCOCET/ROXICET) 5-325 MG tablet Take 1 tablet by mouth 2 (two) times daily as needed. 03/17/21   [provider]    Allergies    Niaspan [niacin er] and Sulfa antibiotics  Review of Systems   Review of Systems  Constitutional:  Negative for appetite change and fever.  Respiratory:  Negative for shortness of breath.   Gastrointestinal:  Negative for abdominal pain, constipation, diarrhea, nausea and vomiting.  Genitourinary:  Negative for decreased urine volume, difficulty urinating, dysuria, flank pain and hematuria.  Musculoskeletal:  Positive for back pain. Negative for joint swelling.  Skin:  Negative for rash and wound.  Neurological:  Negative for dizziness, syncope, facial asymmetry, weakness, numbness and headaches.  Psychiatric/Behavioral:  Positive for confusion.   All other systems reviewed and are negative.  Physical Exam Updated Vital Signs BP 134/70   Pulse 65   Temp 97.7 F (36.5 C) (Oral)   Resp 17   SpO2 98%   Physical Exam Vitals and nursing note reviewed.  Constitutional:      General: He is not in acute distress.    Appearance: Normal appearance. He is well-developed. He is not toxic-appearing.  HENT:     Head: Atraumatic.     Mouth/Throat:     Mouth: Mucous membranes are dry.  Eyes:     Extraocular Movements: Extraocular movements intact.     Conjunctiva/sclera: Conjunctivae normal.     Pupils: Pupils are equal, round, and reactive to light.  Cardiovascular:     Rate and Rhythm: Normal rate and regular rhythm.     Pulses: Normal pulses.     Comments: DP pulses are strong and palpable bilaterally Pulmonary:     Effort: Pulmonary effort is normal. No respiratory distress.     Breath  sounds: Normal breath sounds.  Chest:     Chest wall: No tenderness.  Abdominal:     General: There is no distension.     Palpations: Abdomen is soft.     Tenderness: There is no abdominal tenderness.  Musculoskeletal:        General: Tenderness present. No swelling or signs of injury.     Cervical back: Normal range of motion and neck supple.     Lumbar back: Tenderness present. No swelling, deformity or lacerations. Normal range of motion.     Right lower leg: No edema.     Left lower leg: No edema.     Comments: Tenderness to palpation of the lower lumbar spine and  left paraspinal muscles.  Mild tenderness also noted at the left SI joint space.  Hip flexors and extensors are intact. Pt has 5/5 strength against resistance of bilateral lower extremities.     Skin:    General: Skin is warm.     Capillary Refill: Capillary refill takes less than 2 seconds.     Findings: No erythema or rash.  Neurological:     General: No focal deficit present.     Mental Status: He is alert.     Sensory: Sensation is intact. No sensory deficit.     Motor: Motor function is intact. No weakness or abnormal muscle tone.     Coordination: Coordination is intact. Coordination normal.     Deep Tendon Reflexes:     Reflex Scores:      Patellar reflexes are 2+ on the right side and 2+ on the left side.      Achilles reflexes are 2+ on the right side and 2+ on the left side.    Comments: CN II through XII grossly intact.  Speech clear.  No facial droop or pronator drift.  Patient appears alert and mentating well.    ED Results / Procedures / Treatments   Labs (all labs ordered are listed, but only abnormal results are displayed) Labs Reviewed  CBC WITH DIFFERENTIAL/PLATELET - Abnormal; Notable for the following components:      Result Value   RBC 3.46 (*)    Hemoglobin 12.1 (*)    HCT 37.4 (*)    MCV 108.1 (*)    MCH 35.0 (*)    All other components within normal limits  BASIC METABOLIC PANEL -  Abnormal; Notable for the following components:   Glucose, Bld 282 (*)    BUN 67 (*)    Creatinine, Ser 3.10 (*)    GFR, Estimated 20 (*)    All other components within normal limits  URINALYSIS, ROUTINE W REFLEX MICROSCOPIC - Abnormal; Notable for the following components:   Glucose, UA 100 (*)    All other components within normal limits  I-STAT CHEM 8, ED    EKG None  Radiology MR LUMBAR SPINE WO CONTRAST  Result Date: 03/19/2021 CLINICAL DATA:  Low back pain, progressive neurologic deficit Low back pain, increased fracture risk EXAM: MRI LUMBAR SPINE WITHOUT CONTRAST TECHNIQUE: Multiplanar, multisequence MR imaging of the lumbar spine was performed. No intravenous contrast was administered. COMPARISON:  None. FINDINGS: Motion artifact is present. Segmentation: Standard. Alignment:  Anteroposterior alignment is maintained Vertebrae: Vertebral body heights are preserved. Mild degenerative endplate irregularity at L4-L5. No substantial marrow edema. No suspicious osseous lesion. Conus medullaris and cauda equina: Conus extends to the L1 level. Conus and cauda equina appear normal. Paraspinal and other soft tissues: Unremarkable. Disc levels: L1-L2:  No canal or foraminal stenosis. L2-L3:  No canal or foraminal stenosis. L3-L4: Disc bulge with endplate osteophytic ridging. Facet arthropathy with ligamentum flavum infolding. Minor canal stenosis. Mild right foraminal stenosis. No left foraminal stenosis. L4-L5: Disc bulge with superimposed left subarticular extrusion extending just below disc level. Facet arthropathy with ligamentum flavum infolding. Minor canal stenosis. Partial effacement of subarticular recesses. Mild foraminal stenosis. Disc herniation compresses the traversing left L5 nerve roots below disc level. L5-S1: Disc bulge with endplate osteophytic ridging. Facet arthropathy with ligamentum flavum infolding. No canal stenosis. Partial effacement of the right subarticular recess. Mild  to moderate right and mild left foraminal stenosis. IMPRESSION: Multilevel degenerative changes as detailed above. Most notably, a disc herniation at  L4-L5 compresses the traversing left L5 nerve roots below disc level. Multilevel facet arthropathy. Electronically Signed   By: Macy Mis M.D.   On: 03/19/2021 14:25    Procedures Procedures   Medications Ordered in ED Medications  fentaNYL (SUBLIMAZE) injection 25 mcg (0 mcg Intravenous Hold 03/19/21 1258)  sodium chloride 0.9 % bolus 500 mL (0 mLs Intravenous Stopped 03/19/21 1651)  HYDROcodone-acetaminophen (NORCO/VICODIN) 5-325 MG per tablet 1 tablet (1 tablet Oral Given 03/19/21 1601)    ED Course  I have reviewed the triage vital signs and the nursing notes.  Pertinent labs & imaging results that were available during my care of the patient were reviewed by me and considered in my medical decision making (see chart for details).    MDM Rules/Calculators/A&P                           Patient here for acute on chronic low back pain.  Seen by orthopedics in [place] and recommended to come here for further evaluation and possible MRI per patient and his son who also provides history.  Labs interpreted by me, show elevated kidney function with BUN at 67 and creatinine elevated from baseline at 3.  Clinically, patient appears dehydrated.  I feel that his elevated creatinine is likely secondary to dehydration will give fluid bolus and oral fluid challenge and reassess.  On recheck, patient has received IV fluids tolerated oral fluids without difficulty.  Creatinine improved and now at baseline.  BUN also improved.  I feel the patient is appropriate for discharge home, he has pain medication at home.  Son indicates that his oxycodone may be causing some confusion, I have recommended to half his tablet every 6 hours if needed.  He will follow-up with orthopedics and has appointment with physiatrist.  No clinical signs for CVA or TIA or  sepsis  Final Clinical Impression(s) / ED Diagnoses Final diagnoses:  Acute left-sided low back pain with left-sided sciatica    Rx / DC Orders ED Discharge Orders     None        Kem Parkinson, PA-C 03/22/21 1503    Elnora Morrison, MD 03/22/21 2353

## 2021-03-19 NOTE — ED Triage Notes (Signed)
Back pain since Thanksgiving, states his pain medication is making him confused

## 2021-03-23 ENCOUNTER — Ambulatory Visit: Payer: Medicare PPO | Admitting: Physical Medicine and Rehabilitation

## 2021-03-23 ENCOUNTER — Ambulatory Visit: Payer: Self-pay

## 2021-03-23 ENCOUNTER — Other Ambulatory Visit: Payer: Self-pay

## 2021-03-23 ENCOUNTER — Encounter: Payer: Self-pay | Admitting: Physical Medicine and Rehabilitation

## 2021-03-23 VITALS — BP 105/64 | HR 67

## 2021-03-23 DIAGNOSIS — M47816 Spondylosis without myelopathy or radiculopathy, lumbar region: Secondary | ICD-10-CM

## 2021-03-23 DIAGNOSIS — M5416 Radiculopathy, lumbar region: Secondary | ICD-10-CM

## 2021-03-23 DIAGNOSIS — M5116 Intervertebral disc disorders with radiculopathy, lumbar region: Secondary | ICD-10-CM

## 2021-03-23 DIAGNOSIS — M4726 Other spondylosis with radiculopathy, lumbar region: Secondary | ICD-10-CM

## 2021-03-23 MED ORDER — METHYLPREDNISOLONE ACETATE 80 MG/ML IJ SUSP
80.0000 mg | Freq: Once | INTRAMUSCULAR | Status: AC
  Administered 2021-03-23: 80 mg

## 2021-03-23 NOTE — Progress Notes (Signed)
Jeremy Stephens - 75 y.o. male MRN [id number]  Date of birth: [birth year]  Office Visit Note: Visit Date: 03/23/2021 PCP: Tempie Hoist, FNP Referred by: Tempie Hoist, FNP  Subjective: Chief Complaint  Patient presents with   Lower Back - Pain   HPI: Jeremy Stephens is a 75 y.o. male who comes in today for evaluation of chronic, worsening and severe left sided lower back pain radiating to buttock, hip and down anterior leg. Patient is accompanied by his wife and son during our visit today. Patient reports pain has been ongoing for several years. Patient reports pain is exacerbated by movement and activity. He describes pain as sharp and shooting in nature, currently rates as 10 out of 10. Patient reports some pain relief with rest and Oxycodone. Patient was recently seen in the emergency department for acute exacerbation severe uncontrolled back pain and left leg pain. Patient's recent lumbar MRI exhibits multi-level facet hypertrophy and disc herniation at L4-L5 compresses the traversing left L5 nerve roots below disc level. No high grade spinal canal stenosis noted. Patient was previously treated by Dr. Rodell Perna in 2020 who recommended patient be evaluated by Dr. Magnus Sinning for potential lumbar epidural steroid injection. When patient was seen in our office in 2020 he reported feeling better and did hold off on lumbar epidural injection at that time. At that time, Dr. Lorin Mercy and Dr. Ernestina Patches did discuss the possibility of lumbar decompression with patient, however he wanted to hold off on any interventions because he was feeling better. Patient now reports his pain is debilitating and unbearable today. Patient reports he suffered with COVID several years ago and since has been experiencing issues with gait and currently uses a walker to help with balance and prevent falls. Patient denies focal weakness, numbness and tingling. Patient denies recent trauma or falls.   Review of Systems   Musculoskeletal:  Positive for back pain.  Neurological:  Negative for tingling, sensory change, focal weakness and weakness.  All other systems reviewed and are negative. Otherwise per HPI.  Assessment & Plan: Visit Diagnoses:    ICD-10-CM   1. Lumbar radiculopathy  M54.16 XR C-ARM NO REPORT    Epidural Steroid injection    methylPREDNISolone acetate (DEPO-MEDROL) injection 80 mg    2. 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 Facet hypertrophy of lumbar region  M47.816        Plan: Findings:  Chronic, worsening and severe left sided lower back pain radiating to buttock, hip and down anterior leg. Patient continues to have excruciating and debilitating pain despite good conservative therapies such as rest and medications. Patient's clinical presentation and exam are consistent with classic L5 nerve pattern. Patient's recent lumbar MRI does exhibit disc herniation at L4-L5 compressing the left L5 nerve root. We believe the next step is to perform a diagnostic and hopefully therapeutic left L5 transforaminal epidural steroid injection under fluoroscopic guidance. Due to a cancellation we were able to complete epidural injection today without difficulty. Patient instructed to follow-up with Dr. Rodell Perna for re-evaluation. Patient encouraged to continue current medication regimen and to remain active as tolerated. No red flag symptoms noted upon exam today.    Meds & Orders:  Meds ordered this encounter  Medications   methylPREDNISolone acetate (DEPO-MEDROL) injection 80 mg     Orders Placed This Encounter  Procedures   XR C-ARM  NO REPORT   Epidural Steroid injection     Follow-up: Return for Left L5 transforaminal epidural steroid injection, visit to requesting provider as needed.   Procedures: No procedures performed  Lumbosacral Transforaminal Epidural Steroid Injection -  Sub-Pedicular Approach with Fluoroscopic Guidance  Patient: Jeremy Stephens      Date of Birth: [birth year] MRN: [id number] PCP: Tempie Hoist, FNP      Visit Date: 03/23/2021   Universal Protocol:    Date/Time: 03/23/2021  Consent Given By: the patient  Position: PRONE  Additional Comments: Vital signs were monitored before and after the procedure. Patient was prepped and draped in the usual sterile fashion. The correct patient, procedure, and site was verified.   Injection Procedure Details:   Procedure diagnoses: Lumbar radiculopathy [M54.16]    Meds Administered:  Meds ordered this encounter  Medications   methylPREDNISolone acetate (DEPO-MEDROL) injection 80 mg    Laterality: Left  Location/Site: L5  Needle:5.0 in., 22 ga.  Short bevel or Quincke spinal needle  Needle Placement: Transforaminal  Findings:    -Comments: Excellent flow of contrast along the nerve, nerve root and into the epidural space.  Procedure Details: After squaring off the end-plates to get a true AP view, the C-arm was positioned so that an oblique view of the foramen as noted above was visualized. The target area is just inferior to the "nose of the scotty dog" or sub pedicular. The soft tissues overlying this structure were infiltrated with 2-3 ml. of 1% Lidocaine without Epinephrine.  The spinal needle was inserted toward the target using a "trajectory" view along the fluoroscope beam.  Under AP and lateral visualization, the needle was advanced so it did not puncture dura and was located close the 6 O'Clock position of the pedical in AP tracterory. Biplanar projections were used to confirm position. Aspiration was confirmed to be negative for CSF and/or blood. A 1-2 ml. volume of Isovue-250 was injected and flow of contrast was noted at each level. Radiographs were obtained for documentation purposes.   After attaining the desired flow of contrast documented above, a 0.5 to 1.0 ml test dose  of 0.25% Marcaine was injected into each respective transforaminal space.  The patient was observed for 90 seconds post injection.  After no sensory deficits were reported, and normal lower extremity motor function was noted,   the above injectate was administered so that equal amounts of the injectate were placed at each foramen (level) into the transforaminal epidural space.   Additional Comments:  No complications occurred Dressing: 2 x 2 sterile gauze and Band-Aid    Post-procedure details: Patient was observed during the procedure. Post-procedure instructions were reviewed.  Patient left the clinic in stable condition.   Clinical History: No specialty comments available.   He reports that he has never smoked. He has never used smokeless tobacco. No results for input(s): HGBA1C, LABURIC in the last 8760 hours.  Objective:  VS:  HT:    WT:   BMI:     BP:105/64  HR:67bpm  TEMP: ( )  RESP:  Physical Exam Vitals and nursing note reviewed.  HENT:     Head: Normocephalic and atraumatic.     Right Ear: External ear normal.     Left Ear: External ear normal.     Nose: Nose normal.     Mouth/Throat:     Mouth: Mucous membranes are moist.  Eyes:     Extraocular Movements: Extraocular movements intact.  Cardiovascular:     Rate  and Rhythm: Normal rate.     Pulses: Normal pulses.  Pulmonary:     Effort: Pulmonary effort is normal.  Abdominal:     General: Abdomen is flat. There is no distension.  Musculoskeletal:        General: Tenderness present.     Cervical back: Normal range of motion.     Comments: Pt is slow to rise from seated position to standing. Good lumbar range of motion. Strong distal strength without clonus, no pain upon palpation of greater trochanters. Sensation intact bilaterally. Dysesthesias noted to left L5 dermatome. Ambulates with walker, gait slow and unsteady. Positive slump test.    Skin:    General: Skin is warm and dry.     Capillary Refill:  Capillary refill takes less than 2 seconds.  Neurological:     Mental Status: He is alert and oriented to person, place, and time.     Gait: Gait abnormal.  Psychiatric:        Mood and Affect: Mood normal.    Ortho Exam  Imaging: No results found.  Past Medical/Family/Surgical/Social History: Medications & Allergies reviewed per EMR, new medications updated. 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   Past Medical History:  Diagnosis Date   Diabetes mellitus without complication ([place])    Hyperlipidemia    Family History  Problem Relation Age of Onset   Multiple myeloma Mother 4   Heart attack Father 43       died of heart attack   Heart attack Brother    COPD Brother    Stomach cancer Paternal Uncle        1970   Past Surgical History:  Procedure Laterality Date   CARDIAC VALVE REPLACEMENT     [place]   CHOLECYSTECTOMY     [place]   Social History   Occupational History   Occupation: retired  Tobacco Use   Smoking status: Never   Smokeless tobacco: Never  Vaping Use   Vaping Use: Never used  Substance and Sexual Activity   Alcohol use: Not Currently   Drug use: Not Currently   Sexual activity: Not Currently

## 2021-03-23 NOTE — Progress Notes (Signed)
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  1. General activity like being  able to carry out your everyday physical activities such as walking, climbing stairs, carrying groceries, or moving a chair?  Rating(10)

## 2021-03-25 NOTE — Procedures (Signed)
Lumbosacral Transforaminal Epidural Steroid Injection - Sub-Pedicular Approach with Fluoroscopic Guidance  Patient: Jeremy Stephens      Date of Birth: 1946-02-17 MRN: [id number] PCP: Tempie Hoist, FNP      Visit Date: 03/23/2021   Universal Protocol:    Date/Time: 03/23/2021  Consent Given By: the patient  Position: PRONE  Additional Comments: Vital signs were monitored before and after the procedure. Patient was prepped and draped in the usual sterile fashion. The correct patient, procedure, and site was verified.   Injection Procedure Details:   Procedure diagnoses: Lumbar radiculopathy [M54.16]    Meds Administered:  Meds ordered this encounter  Medications   methylPREDNISolone acetate (DEPO-MEDROL) injection 80 mg    Laterality: Left  Location/Site: L5  Needle:5.0 in., 22 ga.  Short bevel or Quincke spinal needle  Needle Placement: Transforaminal  Findings:    -Comments: Excellent flow of contrast along the nerve, nerve root and into the epidural space.  Procedure Details: After squaring off the end-plates to get a true AP view, the C-arm was positioned so that an oblique view of the foramen as noted above was visualized. The target area is just inferior to the "nose of the scotty dog" or sub pedicular. The soft tissues overlying this structure were infiltrated with 2-3 ml. of 1% Lidocaine without Epinephrine.  The spinal needle was inserted toward the target using a "trajectory" view along the fluoroscope beam.  Under AP and lateral visualization, the needle was advanced so it did not puncture dura and was located close the 6 O'Clock position of the pedical in AP tracterory. Biplanar projections were used to confirm position. Aspiration was confirmed to be negative for CSF and/or blood. A 1-2 ml. volume of Isovue-250 was injected and flow of contrast was noted at each level. Radiographs were obtained for documentation purposes.   After attaining the desired flow  of contrast documented above, a 0.5 to 1.0 ml test dose of 0.25% Marcaine was injected into each respective transforaminal space.  The patient was observed for 90 seconds post injection.  After no sensory deficits were reported, and normal lower extremity motor function was noted,   the above injectate was administered so that equal amounts of the injectate were placed at each foramen (level) into the transforaminal epidural space.   Additional Comments:  No complications occurred Dressing: 2 x 2 sterile gauze and Band-Aid    Post-procedure details: Patient was observed during the procedure. Post-procedure instructions were reviewed.  Patient left the clinic in stable condition.

## 2021-04-13 ENCOUNTER — Ambulatory Visit: Payer: Medicare PPO | Admitting: Orthopaedic Surgery

## 2021-05-17 ENCOUNTER — Inpatient Hospital Stay (HOSPITAL_COMMUNITY): Payer: Medicare PPO

## 2021-05-17 ENCOUNTER — Inpatient Hospital Stay (HOSPITAL_COMMUNITY): Payer: Medicare PPO | Attending: Hematology | Admitting: Hematology

## 2021-05-17 ENCOUNTER — Other Ambulatory Visit: Payer: Self-pay

## 2021-05-17 VITALS — BP 117/59 | HR 76 | Temp 98.6°F | Resp 16 | Wt 218.5 lb

## 2021-05-17 DIAGNOSIS — E785 Hyperlipidemia, unspecified: Secondary | ICD-10-CM | POA: Diagnosis not present

## 2021-05-17 DIAGNOSIS — Z85028 Personal history of other malignant neoplasm of stomach: Secondary | ICD-10-CM | POA: Insufficient documentation

## 2021-05-17 DIAGNOSIS — C9000 Multiple myeloma not having achieved remission: Secondary | ICD-10-CM | POA: Insufficient documentation

## 2021-05-17 DIAGNOSIS — Z7982 Long term (current) use of aspirin: Secondary | ICD-10-CM | POA: Diagnosis not present

## 2021-05-17 DIAGNOSIS — D472 Monoclonal gammopathy: Secondary | ICD-10-CM

## 2021-05-17 DIAGNOSIS — Z79899 Other long term (current) drug therapy: Secondary | ICD-10-CM | POA: Insufficient documentation

## 2021-05-17 DIAGNOSIS — E119 Type 2 diabetes mellitus without complications: Secondary | ICD-10-CM | POA: Insufficient documentation

## 2021-05-17 DIAGNOSIS — J449 Chronic obstructive pulmonary disease, unspecified: Secondary | ICD-10-CM | POA: Insufficient documentation

## 2021-05-17 DIAGNOSIS — Z7984 Long term (current) use of oral hypoglycemic drugs: Secondary | ICD-10-CM | POA: Diagnosis not present

## 2021-05-17 LAB — COMPREHENSIVE METABOLIC PANEL
ALT: 32 U/L (ref 0–44)
AST: 40 U/L (ref 15–41)
Albumin: 3.8 g/dL (ref 3.5–5.0)
Alkaline Phosphatase: 33 U/L — ABNORMAL LOW (ref 38–126)
Anion gap: 7 (ref 5–15)
BUN: 20 mg/dL (ref 8–23)
CO2: 28 mmol/L (ref 22–32)
Calcium: 9.3 mg/dL (ref 8.9–10.3)
Chloride: 103 mmol/L (ref 98–111)
Creatinine, Ser: 2.05 mg/dL — ABNORMAL HIGH (ref 0.61–1.24)
GFR, Estimated: 33 mL/min — ABNORMAL LOW (ref >60)
Glucose, Bld: 227 mg/dL — ABNORMAL HIGH (ref 70–99)
Potassium: 4.5 mmol/L (ref 3.5–5.1)
Sodium: 138 mmol/L (ref 135–145)
Total Bilirubin: 0.7 mg/dL (ref 0.3–1.2)
Total Protein: 7.2 g/dL (ref 6.5–8.1)

## 2021-05-17 LAB — CBC WITH DIFFERENTIAL/PLATELET
Abs Immature Granulocytes: 0.02 10*3/uL (ref 0.00–0.07)
Basophils Absolute: 0 10*3/uL (ref 0.0–0.1)
Basophils Relative: 0 %
Eosinophils Absolute: 0.2 10*3/uL (ref 0.0–0.5)
Eosinophils Relative: 4 %
HCT: 34.7 % — ABNORMAL LOW (ref 39.0–52.0)
Hemoglobin: 11.6 g/dL — ABNORMAL LOW (ref 13.0–17.0)
Immature Granulocytes: 0 %
Lymphocytes Relative: 43 %
Lymphs Abs: 2.1 10*3/uL (ref 0.7–4.0)
MCH: 36.3 pg — ABNORMAL HIGH (ref 26.0–34.0)
MCHC: 33.4 g/dL (ref 30.0–36.0)
MCV: 108.4 fL — ABNORMAL HIGH (ref 80.0–100.0)
Monocytes Absolute: 0.2 10*3/uL (ref 0.1–1.0)
Monocytes Relative: 5 %
Neutro Abs: 2.4 10*3/uL (ref 1.7–7.7)
Neutrophils Relative %: 48 %
Platelets: 183 10*3/uL (ref 150–400)
RBC: 3.2 MIL/uL — ABNORMAL LOW (ref 4.22–5.81)
RDW: 15.3 % (ref 11.5–15.5)
WBC: 5 10*3/uL (ref 4.0–10.5)
nRBC: 0 % (ref 0.0–0.2)

## 2021-05-17 NOTE — Patient Instructions (Signed)
[place] at [facility] Discharge Instructions  You were seen and examined today by Dr. Delton Coombes. He reviewed your most recent labs that are back and everything looks stable. We will call if there is any problem on the labs that are not back. Your kidney function has improved. We will get you scheduled to have x-rays of your bones. Please keep follow up appointment as scheduled in 4 months.   Thank you for choosing [place] at [facility] to provide your oncology and hematology care.  To afford each patient quality time with our provider, please arrive at least 15 minutes before your scheduled appointment time.   If you have a lab appointment with the [facility] please come in thru the Main Entrance and check in at the main information desk.  You need to re-schedule your appointment should you arrive 10 or more minutes late.  We strive to give you quality time with our providers, and arriving late affects you and other patients whose appointments are after yours.  Also, if you no show three or more times for appointments you may be dismissed from the clinic at the providers discretion.     Again, thank you for choosing [facility]  Our hope is that these requests will decrease the amount of time that you wait before being seen by our physicians.       _____________________________________________________________  Should you have questions after your visit to [facility], please contact our office at [phone] and follow the prompts.  Our office hours are 8:00 a.m. and 4:30 p.m. Monday - Friday.  Please note that voicemails left after 4:00 p.m. may not be returned until the following business day.  We are closed weekends and major holidays.  You do have access to a nurse 24-7, just call the main number to the clinic [phone] and do not press any options, hold on the line and a nurse will answer the phone.    For  prescription refill requests, have your pharmacy contact our office and allow 72 hours.    Due to Covid, you will need to wear a mask upon entering the hospital. If you do not have a mask, a mask will be given to you at the Main Entrance upon arrival. For doctor visits, patients may have 1 support person age 29 or older with them. For treatment visits, patients can not have anyone with them due to social distancing guidelines and our immunocompromised population.

## 2021-05-17 NOTE — Progress Notes (Signed)
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    CANCER STAGING:  Cancer Staging  No matching staging information was found for the patient.  INTERVAL HISTORY:  [name], a 76 y.o. male, returns for routine follow-up of his IgA kappa smoldering multiple myeloma. Jeremy Stephens was last seen on 01/11/2021.   Today he reports feeling good. His weight is stable. He denies recent infections. He presented to the ED on 03/19/21 for lower back pain due to a pinched nerve.   REVIEW OF SYSTEMS:  Review of Systems  Constitutional:  Negative for appetite change, fatigue and unexpected weight change.  Neurological:  Positive for numbness.  All other systems reviewed and are negative.  PAST MEDICAL/SURGICAL HISTORY:  Past Medical History:  Diagnosis Date   Diabetes mellitus without complication ([place])    Hyperlipidemia    Past Surgical History:  Procedure Laterality Date   CARDIAC VALVE REPLACEMENT     [place]   CHOLECYSTECTOMY     [place]    SOCIAL HISTORY:  Social History   Socioeconomic History   Marital status: Married    Spouse name: Not on file   Number of children: Not on file   Years of education: Not on file   Highest education level: Not on file  Occupational History   Occupation: retired  Tobacco Use   Smoking status: Never   Smokeless tobacco: Never  Vaping Use   Vaping Use: Never used  Substance and Sexual Activity   Alcohol use: Not Currently   Drug use: Not Currently   Sexual activity: Not Currently  Other Topics Concern   Not on file  Social History Narrative   Not on file    Social Determinants of Health   Financial Resource Strain: Not on file  Food Insecurity: Not on file  Transportation Needs: Not on file  Physical Activity: Not on file  Stress: Not on file  Social Connections: Not on file  Intimate Partner Violence: Not on file    FAMILY HISTORY:  Family History  Problem Relation Age of Onset   Multiple myeloma Mother 38   Heart attack Father 57       died of heart attack   Heart attack Brother    COPD Brother    Stomach cancer Paternal Uncle        1970    CURRENT MEDICATIONS:  Current Outpatient Medications  Medication Sig Dispense Refill   aspirin 81 MG EC tablet Take 81 mg by mouth daily.     atorvastatin (LIPITOR) 40 MG tablet  See Instructions, TAKE ONE TABLET BY MOUTH AT BEDTIME, # 90 tab, 1 Refill(s), Pharmacy: [facility]     Cholecalciferol (VITAMIN D3) 1.25 MG (50000 UT) CAPS Take 1 capsule by mouth once a week. Takes on Saturday     Coenzyme Q10 100 MG capsule Take 100 mg by mouth daily.     fenofibrate 160 MG tablet Take 160 mg by mouth daily.     furosemide (LASIX) 40 MG tablet Take 40 mg by mouth 2 (two) times daily.     gabapentin (NEURONTIN) 300 MG capsule Take  300 mg by mouth 3 (three) times daily.     glipiZIDE (GLUCOTROL XL) 5 MG 24 hr tablet Take 5 mg by mouth 2 (two) times daily with a meal.     Krill Oil 500 MG CAPS Take 1 tablet by mouth daily.     losartan (COZAAR) 50 MG tablet Take 50 mg by mouth at bedtime.     metoprolol succinate (TOPROL-XL) 25 MG 24 hr tablet Take 25 mg by mouth 2 (two) times daily.     mirtazapine (REMERON) 15 MG tablet  See Instructions, TAKE ONE TABLET BY MOUTH ONCE A DAY AT BEDTIME TO HELP WITH SLEEP AND MOOD, # 90 tab, 1 Refill(s), Pharmacy: [facility]     Multiple Vitamin (MULTIVITAMIN WITH MINERALS) TABS tablet Take 1 tablet by mouth daily.     oxyCODONE-acetaminophen (PERCOCET/ROXICET) 5-325 MG tablet Take 1 tablet by mouth 2 (two) times daily as needed.     No current  facility-administered medications for this visit.    ALLERGIES:  Allergies  Allergen Reactions   Niaspan [Niacin Er]    Sulfa Antibiotics     PHYSICAL EXAM:  Performance status (ECOG): 1 - Symptomatic but completely ambulatory  Vitals:   05/17/21 1441  BP: (!) 117/59  Pulse: 76  Resp: 16  Temp: 98.6 F (37 C)  SpO2: 100%   Wt Readings from Last 3 Encounters:  05/17/21 218 lb 7.6 oz (99.1 kg)  01/11/21 221 lb 6.4 oz (100.4 kg)   Physical Exam Vitals reviewed.  Constitutional:      Appearance: Normal appearance.  Cardiovascular:     Rate and Rhythm: Normal rate and regular rhythm.     Pulses: Normal pulses.     Heart sounds: Normal heart sounds.  Pulmonary:     Effort: Pulmonary effort is normal.     Breath sounds: Normal breath sounds.  Musculoskeletal:     Right lower leg: 2+ Edema present.     Left lower leg: 2+ Edema present.  Neurological:     General: No focal deficit present.     Mental Status: He is alert and oriented to person, place, and time.  Psychiatric:        Mood and Affect: Mood normal.        Behavior: Behavior normal.     LABORATORY DATA:  I have reviewed the labs as listed.  CBC Latest Ref Rng & Units 05/17/2021 03/19/2021 03/19/2021  WBC 4.0 - 10.5 K/uL 5.0 - 5.9  Hemoglobin 13.0 - 17.0 g/dL 11.6(L) 9.5(L) 12.1(L)  Hematocrit 39.0 - 52.0 % 34.7(L) 28.0(L) 37.4(L)  Platelets 150 - 400 K/uL 183 - 171   CMP Latest Ref Rng & Units 05/17/2021 03/19/2021 03/19/2021  Glucose 70 - 99 mg/dL 227(H) 164(H) 282(H)  BUN 8 - 23 mg/dL 20 46(H) 67(H)  Creatinine 0.61 - 1.24 mg/dL 2.05(H) 2.30(H) 3.10(H)  Sodium 135 - 145 mmol/L 138 144 137  Potassium 3.5 - 5.1 mmol/L 4.5 3.4(L) 4.9  Chloride 98 - 111 mmol/L 103 111 100  CO2 22 - 32 mmol/L 28 - 26  Calcium 8.9 - 10.3 mg/dL 9.3 - 9.0  Total Protein 6.5 - 8.1 g/dL 7.2 - -  Total Bilirubin 0.3 - 1.2 mg/dL 0.7 - -  Alkaline Phos 38 - 126 U/L 33(L) - -  AST 15 - 41 U/L 40 - -  ALT 0 - 44 U/L 32 - -     DIAGNOSTIC IMAGING:  I have independently reviewed the scans and discussed with the  patient. No results found.   ASSESSMENT:  IgA kappa smoldering multiple myeloma: - Diagnosed in November 2021 at [facility] hematology oncology. - Bone marrow biopsy with 20% plasma cells.  PET scan was also negative for any lytic lesions. - He had COVID infection in November 2020.  Since then he has been using walker.  2.  Social/family history: - He worked in [employer] and no exposure to chemicals was reported. - Mother died of multiple myeloma.  Brother had lung cancer.  Paternal uncle had stomach cancer.   PLAN:  IgA kappa smoldering multiple myeloma: - He does not report any new onset pains.  He had developed sciatica pain and was evaluated in the ER on 03/19/2021.  MRI of the lower back showed L5 nerve impingement.  He has received epidural injection which helped. - I have reviewed MRI which did not show any lytic lesions. - Reviewed labs from today which showed creatinine 2.05 and calcium 9.3.  Hemoglobin is 11.6 and stable. - SPEP and free light chains today are pending.  We will follow-up on them.  If they are any abnormal, we will call him for further testing. - Otherwise we have given a follow-up appointment in 4 months with repeat labs.  We will also check ferritin and iron panel at that time for work-up of his anemia.  We will do skeletal survey at next visit.   Orders placed this encounter:  No orders of the defined types were placed in this encounter.    Derek Jack, MD [place] [phone]   I, Thana Ates, am acting as a scribe for Dr. Derek Jack.  I, Derek Jack MD, have reviewed the above documentation for accuracy and completeness, and I agree with the above.

## 2021-05-18 LAB — KAPPA/LAMBDA LIGHT CHAINS
Kappa free light chain: 66.1 mg/L — ABNORMAL HIGH (ref 3.3–19.4)
Kappa, lambda light chain ratio: 4.72 — ABNORMAL HIGH (ref 0.26–1.65)
Lambda free light chains: 14 mg/L (ref 5.7–26.3)

## 2021-05-19 LAB — PROTEIN ELECTROPHORESIS, SERUM
A/G Ratio: 1.1 (ref 0.7–1.7)
Albumin ELP: 3.4 g/dL (ref 2.9–4.4)
Alpha-1-Globulin: 0.2 g/dL (ref 0.0–0.4)
Alpha-2-Globulin: 0.7 g/dL (ref 0.4–1.0)
Beta Globulin: 0.9 g/dL (ref 0.7–1.3)
Gamma Globulin: 1.1 g/dL (ref 0.4–1.8)
Globulin, Total: 3 g/dL (ref 2.2–3.9)
M-Spike, %: 0.3 g/dL — ABNORMAL HIGH
Total Protein ELP: 6.4 g/dL (ref 6.0–8.5)

## 2021-05-24 NOTE — Progress Notes (Signed)
Results reviewed at visit by Dr. Delton Coombes

## 2021-06-16 ENCOUNTER — Ambulatory Visit: Payer: Medicare PPO | Admitting: Orthopaedic Surgery

## 2021-09-16 ENCOUNTER — Other Ambulatory Visit (HOSPITAL_COMMUNITY): Payer: Medicare PPO

## 2021-09-17 ENCOUNTER — Ambulatory Visit (HOSPITAL_COMMUNITY)
Admission: RE | Admit: 2021-09-17 | Discharge: 2021-09-17 | Disposition: A | Discharge status: Home / Self Care | Payer: Medicare PPO | Source: Ambulatory Visit | Attending: Hematology | Admitting: Hematology

## 2021-09-17 ENCOUNTER — Inpatient Hospital Stay (HOSPITAL_COMMUNITY): Payer: Medicare PPO | Attending: Hematology

## 2021-09-17 DIAGNOSIS — Z801 Family history of malignant neoplasm of trachea, bronchus and lung: Secondary | ICD-10-CM | POA: Diagnosis not present

## 2021-09-17 DIAGNOSIS — E119 Type 2 diabetes mellitus without complications: Secondary | ICD-10-CM | POA: Insufficient documentation

## 2021-09-17 DIAGNOSIS — C9000 Multiple myeloma not having achieved remission: Secondary | ICD-10-CM | POA: Insufficient documentation

## 2021-09-17 DIAGNOSIS — D472 Monoclonal gammopathy: Secondary | ICD-10-CM | POA: Insufficient documentation

## 2021-09-17 DIAGNOSIS — Z800 Family history of malignant neoplasm of digestive organs: Secondary | ICD-10-CM | POA: Diagnosis not present

## 2021-09-17 LAB — COMPREHENSIVE METABOLIC PANEL
ALT: 32 U/L (ref 0–44)
AST: 38 U/L (ref 15–41)
Albumin: 3.7 g/dL (ref 3.5–5.0)
Alkaline Phosphatase: 32 U/L — ABNORMAL LOW (ref 38–126)
Anion gap: 8 (ref 5–15)
BUN: 22 mg/dL (ref 8–23)
CO2: 28 mmol/L (ref 22–32)
Calcium: 9.1 mg/dL (ref 8.9–10.3)
Chloride: 105 mmol/L (ref 98–111)
Creatinine, Ser: 2.11 mg/dL — ABNORMAL HIGH (ref 0.61–1.24)
GFR, Estimated: 32 mL/min — ABNORMAL LOW (ref >60)
Glucose, Bld: 183 mg/dL — ABNORMAL HIGH (ref 70–99)
Potassium: 5 mmol/L (ref 3.5–5.1)
Sodium: 141 mmol/L (ref 135–145)
Total Bilirubin: 1 mg/dL (ref 0.3–1.2)
Total Protein: 6.9 g/dL (ref 6.5–8.1)

## 2021-09-17 LAB — FERRITIN: Ferritin: 1034 ng/mL — ABNORMAL HIGH (ref 24–336)

## 2021-09-17 LAB — CBC WITH DIFFERENTIAL/PLATELET
Abs Immature Granulocytes: 0.02 10*3/uL (ref 0.00–0.07)
Basophils Absolute: 0 10*3/uL (ref 0.0–0.1)
Basophils Relative: 0 %
Eosinophils Absolute: 0.2 10*3/uL (ref 0.0–0.5)
Eosinophils Relative: 5 %
HCT: 35 % — ABNORMAL LOW (ref 39.0–52.0)
Hemoglobin: 11.4 g/dL — ABNORMAL LOW (ref 13.0–17.0)
Immature Granulocytes: 0 %
Lymphocytes Relative: 41 %
Lymphs Abs: 2 10*3/uL (ref 0.7–4.0)
MCH: 34.9 pg — ABNORMAL HIGH (ref 26.0–34.0)
MCHC: 32.6 g/dL (ref 30.0–36.0)
MCV: 107 fL — ABNORMAL HIGH (ref 80.0–100.0)
Monocytes Absolute: 0.3 10*3/uL (ref 0.1–1.0)
Monocytes Relative: 6 %
Neutro Abs: 2.4 10*3/uL (ref 1.7–7.7)
Neutrophils Relative %: 48 %
Platelets: 155 10*3/uL (ref 150–400)
RBC: 3.27 MIL/uL — ABNORMAL LOW (ref 4.22–5.81)
RDW: 14.4 % (ref 11.5–15.5)
WBC: 4.9 10*3/uL (ref 4.0–10.5)
nRBC: 0 % (ref 0.0–0.2)

## 2021-09-17 LAB — LACTATE DEHYDROGENASE: LDH: 160 U/L (ref 98–192)

## 2021-09-17 LAB — IRON AND TIBC
Iron: 132 ug/dL (ref 45–182)
Saturation Ratios: 29 % (ref 17.9–39.5)
TIBC: 463 ug/dL — ABNORMAL HIGH (ref 250–450)
UIBC: 331 ug/dL

## 2021-09-20 LAB — PROTEIN ELECTROPHORESIS, SERUM
A/G Ratio: 1.1 (ref 0.7–1.7)
Albumin ELP: 3.4 g/dL (ref 2.9–4.4)
Alpha-1-Globulin: 0.2 g/dL (ref 0.0–0.4)
Alpha-2-Globulin: 0.7 g/dL (ref 0.4–1.0)
Beta Globulin: 1 g/dL (ref 0.7–1.3)
Gamma Globulin: 1.1 g/dL (ref 0.4–1.8)
Globulin, Total: 3 g/dL (ref 2.2–3.9)
M-Spike, %: 0.3 g/dL — ABNORMAL HIGH
Total Protein ELP: 6.4 g/dL (ref 6.0–8.5)

## 2021-09-20 LAB — KAPPA/LAMBDA LIGHT CHAINS
Kappa free light chain: 62 mg/L — ABNORMAL HIGH (ref 3.3–19.4)
Kappa, lambda light chain ratio: 4.13 — ABNORMAL HIGH (ref 0.26–1.65)
Lambda free light chains: 15 mg/L (ref 5.7–26.3)

## 2021-09-27 ENCOUNTER — Inpatient Hospital Stay (HOSPITAL_COMMUNITY): Payer: Medicare PPO | Admitting: Hematology

## 2021-09-27 VITALS — BP 123/71 | HR 71 | Temp 98.0°F | Resp 18 | Ht 68.0 in | Wt 220.9 lb

## 2021-09-27 DIAGNOSIS — C9000 Multiple myeloma not having achieved remission: Secondary | ICD-10-CM | POA: Diagnosis not present

## 2021-09-27 DIAGNOSIS — D472 Monoclonal gammopathy: Secondary | ICD-10-CM

## 2021-09-27 NOTE — Patient Instructions (Signed)
[place] at [facility] Discharge Instructions  You were seen and examined today by Dr. Delton Coombes.  Dr. Delton Coombes discussed your most recent lab work and everything looks good.  Follow-up as scheduled in 4 months.    Thank you for choosing [place] at [facility] to provide your oncology and hematology care.  To afford each patient quality time with our provider, please arrive at least 15 minutes before your scheduled appointment time.   If you have a lab appointment with the [facility] please come in thru the Main Entrance and check in at the main information desk.  You need to re-schedule your appointment should you arrive 10 or more minutes late.  We strive to give you quality time with our providers, and arriving late affects you and other patients whose appointments are after yours.  Also, if you no show three or more times for appointments you may be dismissed from the clinic at the providers discretion.     Again, thank you for choosing [facility].  Our hope is that these requests will decrease the amount of time that you wait before being seen by our physicians.       _____________________________________________________________  Should you have questions after your visit to [facility], please contact our office at [phone] and follow the prompts.  Our office hours are 8:00 a.m. and 4:30 p.m. Monday - Friday.  Please note that voicemails left after 4:00 p.m. may not be returned until the following business day.  We are closed weekends and major holidays.  You do have access to a nurse 24-7, just call the main number to the clinic [phone] and do not press any options, hold on the line and a nurse will answer the phone.    For prescription refill requests, have your pharmacy contact our office and allow 72 hours.    Due to Covid, you will need to wear a mask upon entering the hospital. If you do  not have a mask, a mask will be given to you at the Main Entrance upon arrival. For doctor visits, patients may have 1 support person age 52 or older with them. For treatment visits, patients can not have anyone with them due to social distancing guidelines and our immunocompromised population.

## 2021-09-27 NOTE — Progress Notes (Signed)
f °

## 2021-09-27 NOTE — Progress Notes (Signed)
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, a 76 y.o. male, returns for routine follow-up for his IgA kappa smoldering multiple myeloma. Jeremy Stephens was last seen on 05/17/2021.  Today he reports feeling good. He is not currently taking iron tablets. He denies new bone pains.   REVIEW OF SYSTEMS:  Review of Systems  Constitutional:  Negative for appetite change and fatigue.  Musculoskeletal:  Positive for arthralgias (7/10 legs).  Neurological:  Positive for numbness (feet).  All other systems reviewed and are negative.   PAST MEDICAL/SURGICAL HISTORY:  Past Medical History:  Diagnosis Date   Diabetes mellitus without complication ([place])    Hyperlipidemia    Past Surgical History:  Procedure Laterality Date   CARDIAC VALVE REPLACEMENT     [place]   CHOLECYSTECTOMY     [place]    SOCIAL HISTORY:  Social History   Socioeconomic History   Marital status: Married    Spouse name: Not on file   Number of children: Not on file   Years of education: Not on file   Highest education level: Not on file  Occupational History   Occupation: retired  Tobacco Use   Smoking status: Never   Smokeless tobacco: Never  Vaping Use   Vaping Use: Never used  Substance and Sexual Activity   Alcohol use: Not Currently   Drug use: Not Currently   Sexual activity: Not Currently  Other Topics Concern   Not on file  Social History Narrative   Not on file   Social Determinants of Health   Financial Resource Strain: Not on file  Food Insecurity: Not on file  Transportation Needs: Not on file  Physical Activity: Not on file  Stress: Not on file  Social  Connections: Not on file  Intimate Partner Violence: Not on file    FAMILY HISTORY:  Family History  Problem Relation Age of Onset   Multiple myeloma Mother 69   Heart attack Father 15       died of heart attack   Heart attack Brother    COPD Brother    Stomach cancer Paternal Uncle        1970    CURRENT MEDICATIONS:  Current Outpatient Medications  Medication Sig Dispense Refill   aspirin 81 MG EC tablet Take 81 mg by mouth daily.     atorvastatin (LIPITOR) 40 MG tablet  See Instructions, TAKE ONE TABLET BY MOUTH AT BEDTIME, # 90 tab, 1 Refill(s), Pharmacy: [facility]     Cholecalciferol (VITAMIN D3) 1.25 MG (50000 UT) CAPS Take 1 capsule by mouth once a week. Takes on Saturday     Coenzyme Q10 100 MG capsule Take 100 mg by mouth daily.     fenofibrate 160 MG tablet Take 160 mg by mouth daily.     furosemide (LASIX) 40 MG tablet Take 40 mg by mouth 2 (two) times daily.     gabapentin (NEURONTIN) 300 MG capsule Take 300 mg by mouth 3 (three) times daily.     glipiZIDE (GLUCOTROL XL) 5 MG 24 hr tablet Take 5 mg by mouth 2 (two) times daily with a meal.     Krill Oil 500 MG CAPS  Take 1 tablet by mouth daily.     losartan (COZAAR) 50 MG tablet Take 50 mg by mouth at bedtime.     metoprolol succinate (TOPROL-XL) 25 MG 24 hr tablet Take 25 mg by mouth 2 (two) times daily.     mirtazapine (REMERON) 15 MG tablet  See Instructions, TAKE ONE TABLET BY MOUTH ONCE A DAY AT BEDTIME TO HELP WITH SLEEP AND MOOD, # 90 tab, 1 Refill(s), Pharmacy: [facility]     Multiple Vitamin (MULTIVITAMIN WITH MINERALS) TABS tablet Take 1 tablet by mouth daily.     oxyCODONE-acetaminophen (PERCOCET/ROXICET) 5-325 MG tablet Take 1 tablet by mouth 2 (two) times daily as needed.     No current facility-administered medications for this visit.    ALLERGIES:  Allergies  Allergen Reactions   Niaspan [Niacin Er]    Sulfa Antibiotics     PHYSICAL EXAM:  Performance status (ECOG): 1 -  Symptomatic but completely ambulatory  There were no vitals filed for this visit. Wt Readings from Last 3 Encounters:  05/17/21 218 lb 7.6 oz (99.1 kg)  01/11/21 221 lb 6.4 oz (100.4 kg)   Physical Exam Vitals reviewed.  Constitutional:      Appearance: Normal appearance. He is obese.  Cardiovascular:     Rate and Rhythm: Normal rate and regular rhythm.     Pulses: Normal pulses.     Heart sounds: Normal heart sounds.  Pulmonary:     Effort: Pulmonary effort is normal.     Breath sounds: Normal breath sounds.  Neurological:     General: No focal deficit present.     Mental Status: He is alert and oriented to person, place, and time.  Psychiatric:        Mood and Affect: Mood normal.        Behavior: Behavior normal.     LABORATORY DATA:  I have reviewed the labs as listed.     Latest Ref Rng & Units 09/17/2021   11:15 AM 05/17/2021    2:04 PM 03/19/2021    5:07 PM  CBC  WBC 4.0 - 10.5 K/uL 4.9  5.0    Hemoglobin 13.0 - 17.0 g/dL 11.4  11.6  9.5   Hematocrit 39.0 - 52.0 % 35.0  34.7  28.0   Platelets 150 - 400 K/uL 155  183        Latest Ref Rng & Units 09/17/2021   11:15 AM 05/17/2021    2:04 PM 03/19/2021    5:07 PM  CMP  Glucose 70 - 99 mg/dL 183  227  164   BUN 8 - 23 mg/dL 22  20  46   Creatinine 0.61 - 1.24 mg/dL 2.11  2.05  2.30   Sodium 135 - 145 mmol/L 141  138  144   Potassium 3.5 - 5.1 mmol/L 5.0  4.5  3.4   Chloride 98 - 111 mmol/L 105  103  111   CO2 22 - 32 mmol/L 28  28    Calcium 8.9 - 10.3 mg/dL 9.1  9.3    Total Protein 6.5 - 8.1 g/dL 6.9  7.2    Total Bilirubin 0.3 - 1.2 mg/dL 1.0  0.7    Alkaline Phos 38 - 126 U/L 32  33    AST 15 - 41 U/L 38  40    ALT 0 - 44 U/L 32  32        Component Value Date/Time   RBC 3.27 (L) 09/17/2021 1115  MCV 107.0 (H) 09/17/2021 1115   MCH 34.9 (H) 09/17/2021 1115   MCHC 32.6 09/17/2021 1115   RDW 14.4 09/17/2021 1115   LYMPHSABS 2.0 09/17/2021 1115   MONOABS 0.3 09/17/2021 1115   EOSABS 0.2 09/17/2021  1115   BASOSABS 0.0 09/17/2021 1115    DIAGNOSTIC IMAGING:  I have independently reviewed the scans and discussed with the patient. DG Bone Survey Met  Result Date: 09/19/2021 CLINICAL DATA:  Multiple myeloma EXAM: METASTATIC BONE SURVEY COMPARISON:  None Available. FINDINGS: No focal lytic lesions are seen in the visualized axial and appendicular skeleton. Degenerative changes are noted in the cervical spine with disc space narrowing and bony spurs at C4-C5 and C5-C6 levels. Degenerative changes are noted in the left shoulder. There is prosthetic metallic stent in the region of aortic valve. Right lung is smaller than left. There is no focal consolidation. There is no pleural effusion. Degenerative changes are noted with bony spurs in the thoracic and lumbar spine. There is disc space narrowing at L4-L5 and L5-S1 levels. There is edema in the subcutaneous plane in the both lower legs. Arterial calcifications are seen in the soft tissues. IMPRESSION: No focal lytic lesions are seen. Other findings as described in the body of the report. Electronically Signed   By: Elmer Picker M.D.   On: 09/19/2021 16:32     ASSESSMENT:  IgA kappa smoldering multiple myeloma: - Diagnosed in November 2021 at [facility] hematology oncology. - Bone marrow biopsy with 20% plasma cells.  PET scan was also negative for any lytic lesions. - He had COVID infection in November 2020.  Since then he has been using walker.  2.  Social/family history: - He worked in [employer] fund[employer] and no exposure to chemicals was reported. - Mother died of multiple myeloma.  Brother had lung cancer.  Paternal uncle had stomach cancer.   PLAN:  IgA kappa smoldering multiple myeloma: -He does not report any infections or new onset pains. - I have reviewed labs from 09/17/2021.  Creatinine is stable around 2.11.  Calcium is normal.  Hemoglobin is 11.4 and stable.  M spike was 0.3 g and free light chain ratio was 4.13.  Kappa light chains are 62  and stable. - Reviewed bone survey from 09/17/2021 which did not show any lytic lesions. - No "crab" features at this time. - We will continue monitoring with labs in 4 months. - Ferritin is high at 1034 and percent saturation 29.  Recommend discontinuing any iron supplements. - He would like to have labs done on the same day at next visit as he comes from [place].  It is okay with me and I told him that I want to have the myeloma labs results on the same day and asked him to open his MyChart account to see the labs.  Orders placed this encounter:  No orders of the defined types were placed in this encounter.    Derek Jack, MD [place] [phone]   I, Thana Ates, am acting as a scribe for Dr. Derek Jack.  I, Derek Jack MD, have reviewed the above documentation for accuracy and completeness, and I agree with the above.

## 2021-09-29 ENCOUNTER — Ambulatory Visit (HOSPITAL_COMMUNITY): Payer: Medicare PPO | Admitting: Hematology

## 2021-11-03 ENCOUNTER — Ambulatory Visit: Payer: Medicare PPO | Admitting: Podiatry

## 2021-11-03 ENCOUNTER — Encounter: Payer: Self-pay | Admitting: Podiatry

## 2021-11-03 DIAGNOSIS — G629 Polyneuropathy, unspecified: Secondary | ICD-10-CM | POA: Diagnosis not present

## 2021-11-04 NOTE — Progress Notes (Signed)
Subjective:   Patient ID: Jeremy Stephens, male   DOB: 76 y.o.   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  Patient does not smoke is not able to be active   Review of Systems  All other systems reviewed and are negative.       Objective:  Physical Exam Vitals and nursing note reviewed.  Constitutional:      Appearance: He is well-developed.  Pulmonary:     Effort: Pulmonary effort is normal.  Musculoskeletal:        General: Normal range of motion.  Skin:    General: Skin is warm.  Neurological:     Mental Status: He is alert.     Vascular status mildly diminished with neurological diminished sharp dull vibratory DTR reflex.  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  Patient does use a rolling walker and this is the only way he can stay upright properly      Assessment:  Very difficult condition that is related to a systemic condition and most likely does not have any local cause or condition      Plan:  H&P reviewed condition do not see any way I can help with this patient even though I did suggest possibly seeing a vein doctor to maybe see if they could help with swelling but even this will be difficult and physical therapy which they have attempted.  Patient unfortunately does not have a good solution to the systemic issues that have occurred with COVID

## 2022-02-09 ENCOUNTER — Inpatient Hospital Stay: Payer: Medicare PPO

## 2022-02-09 ENCOUNTER — Ambulatory Visit: Payer: Medicare PPO | Admitting: Hematology

## 2022-12-21 IMAGING — DX DG BONE SURVEY MET
9 of 10 series · 9 of 10 positions shown · non-contrast
Comparison: None Available.

CLINICAL DATA: Multiple myeloma

EXAM:
METASTATIC BONE SURVEY

[skull lat]
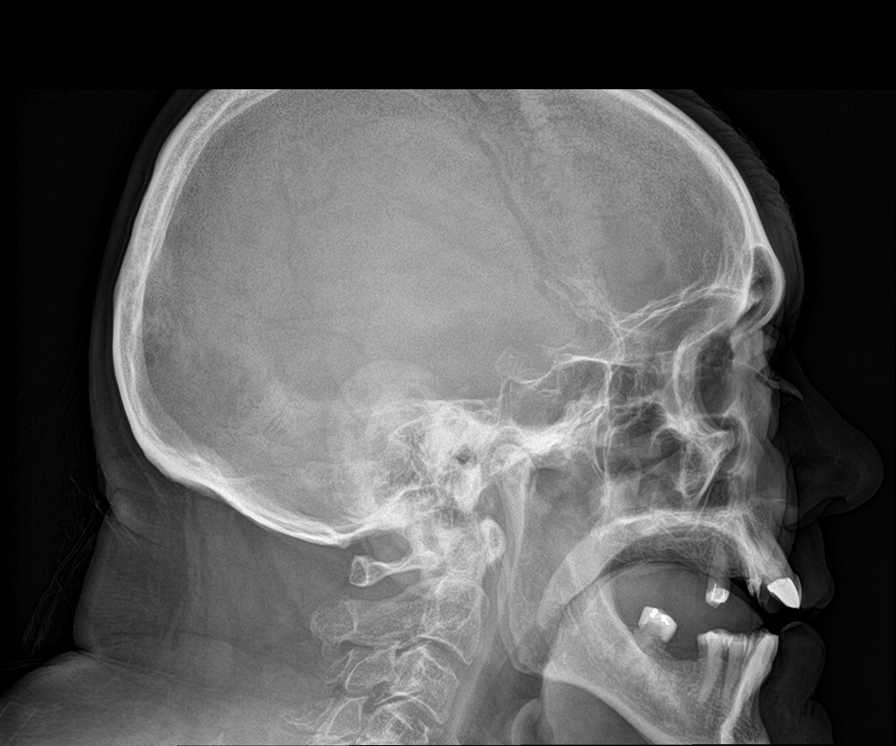

[shoulder ap (1 of 2)]
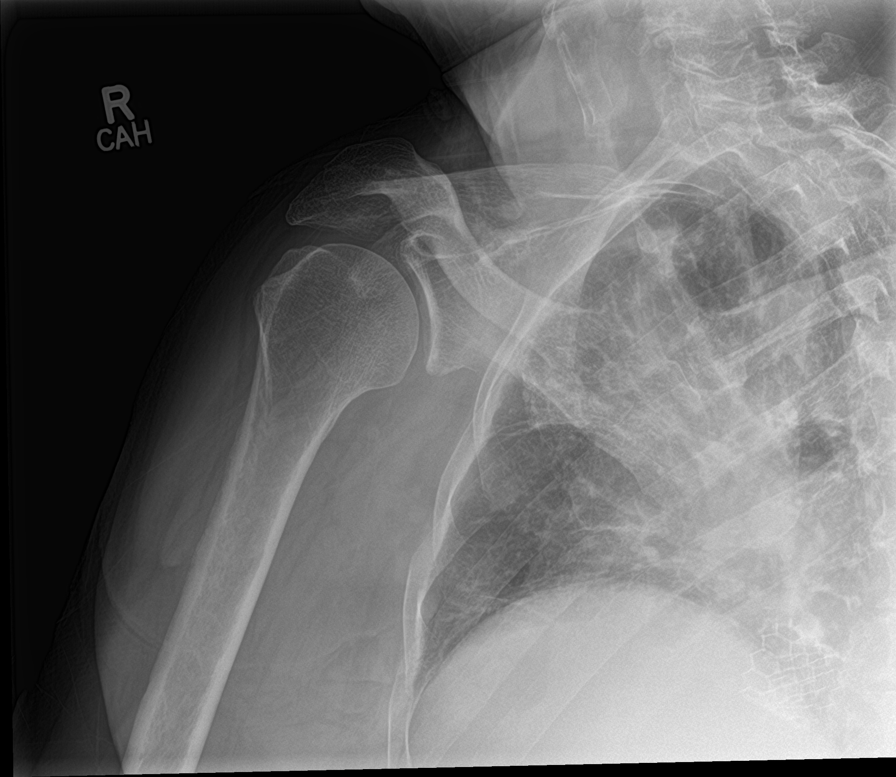

[shoulder ap (2 of 2)]
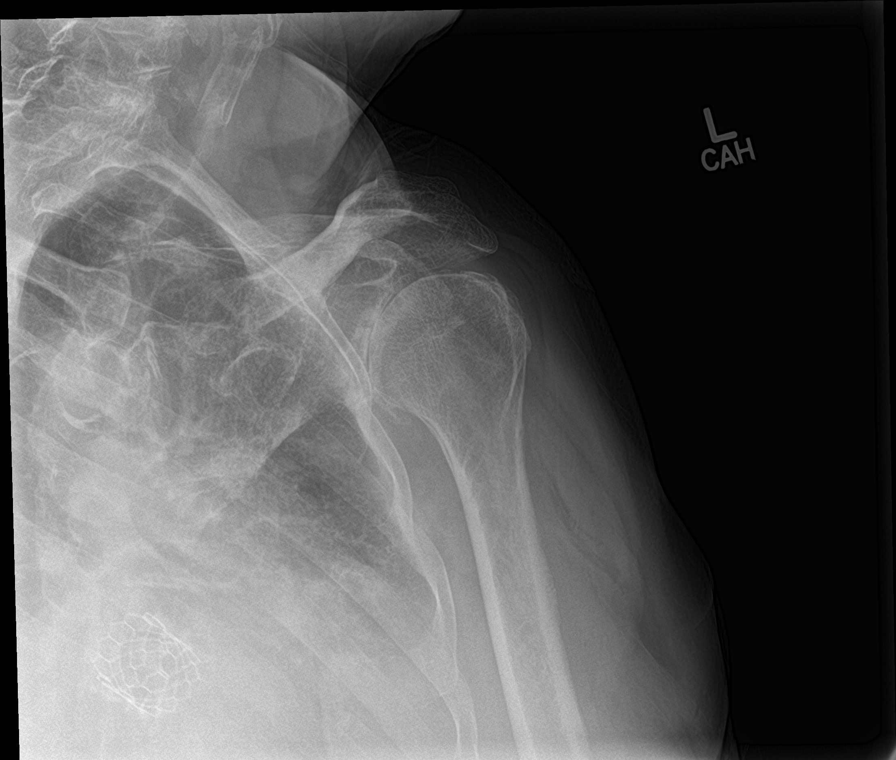

[humerus ap (1 of 2)]
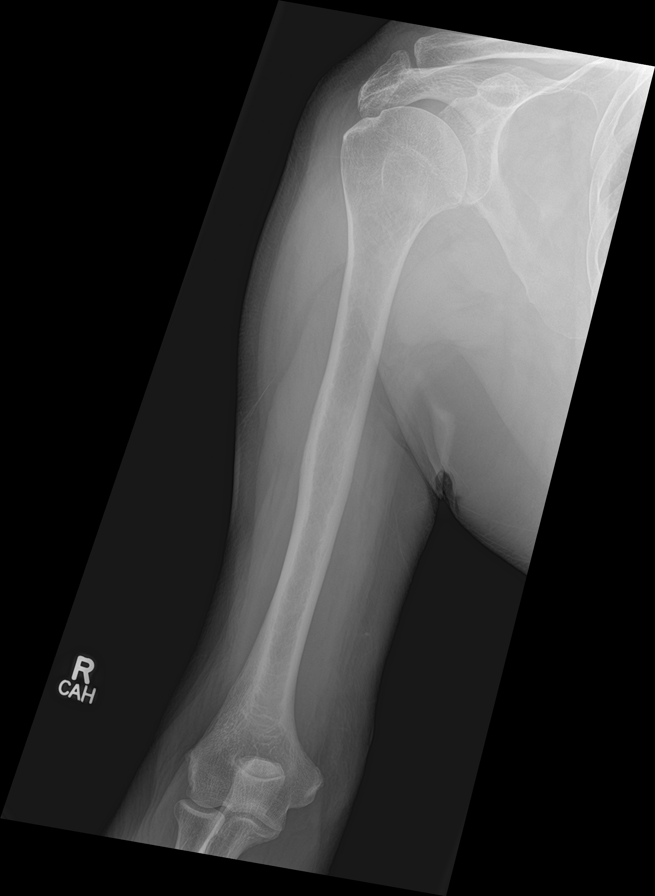

[humerus ap (2 of 2)]
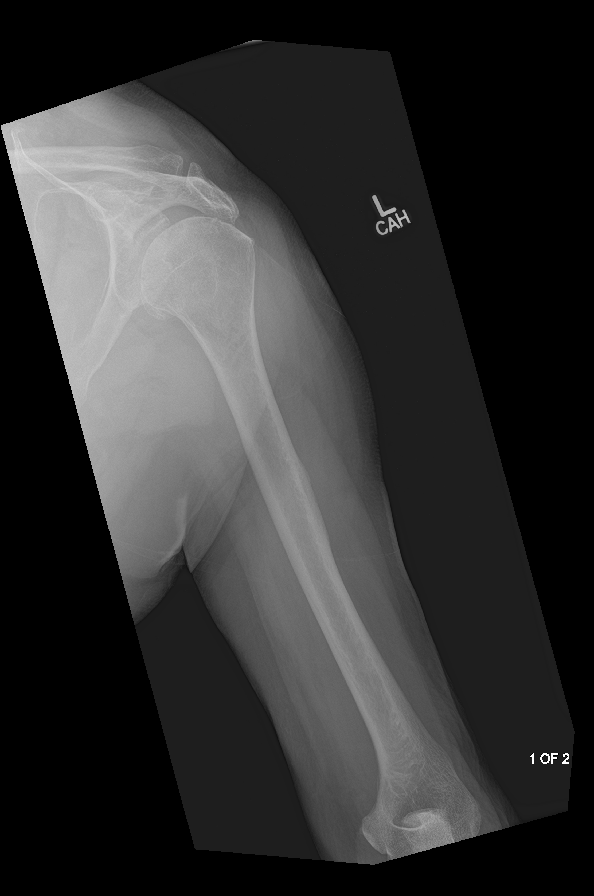

[forearm ap (1 of 2)]
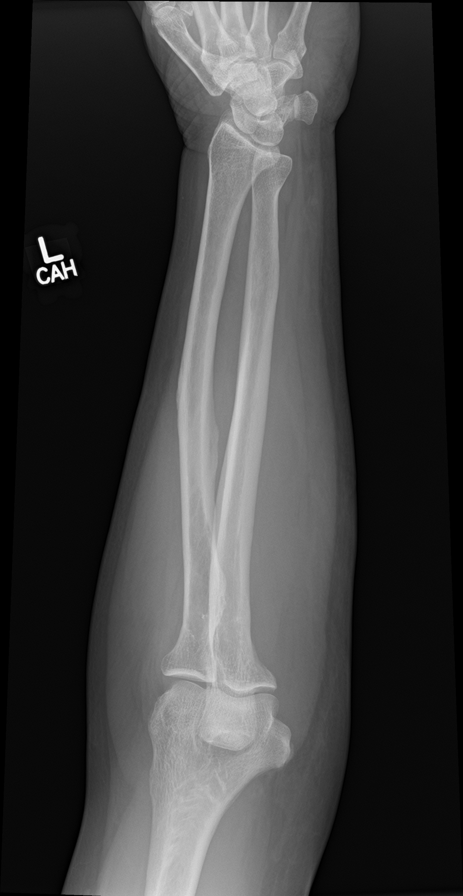

[forearm ap (2 of 2)]
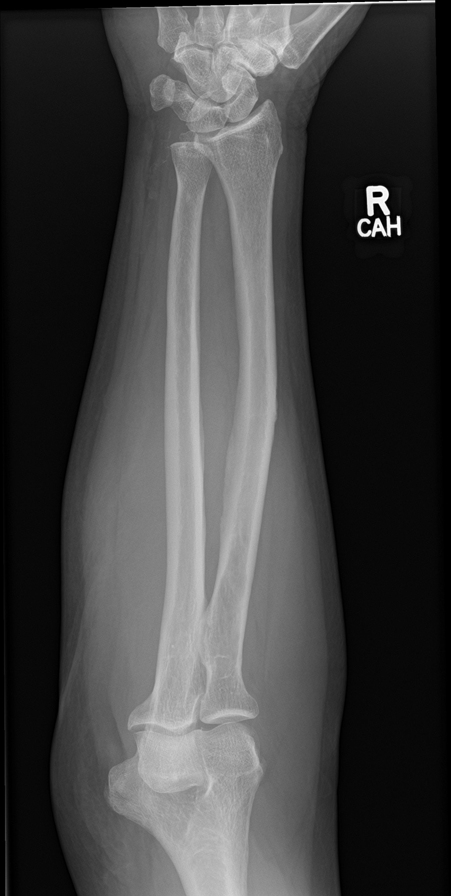

[c-spine ap]
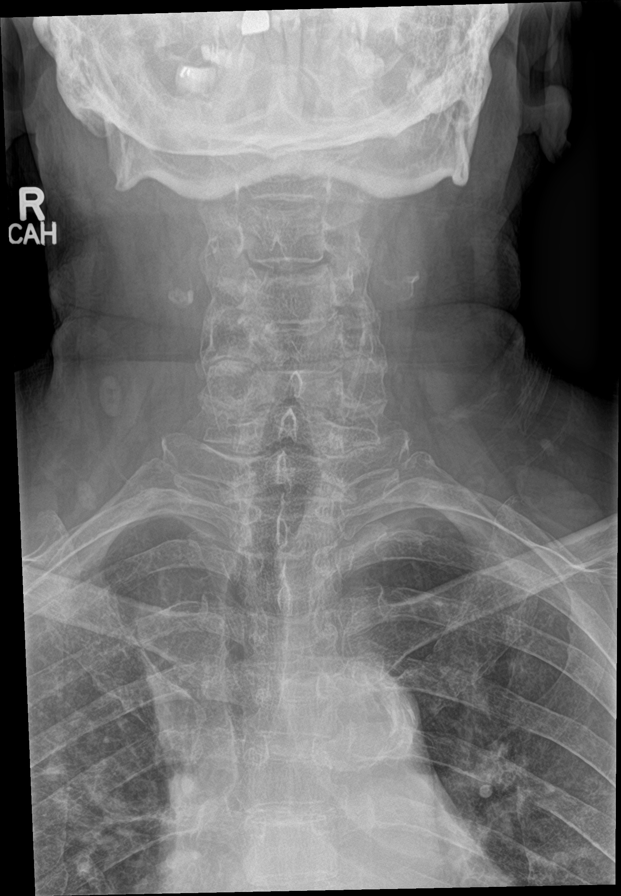

[c-spine lat]
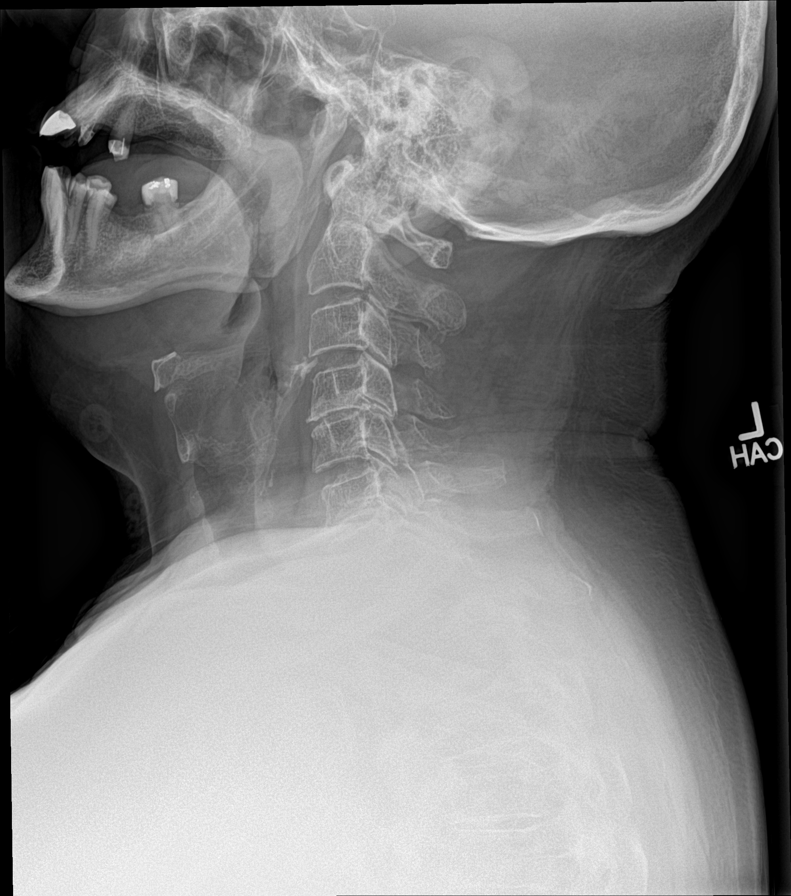

[9 of 10 positions shown; findings below may reference images not displayed]

FINDINGS: No focal lytic lesions are seen in the visualized axial and
appendicular skeleton. Degenerative changes are noted in the
cervical spine with disc space narrowing and bony spurs at C4-C5 and
C5-C6 levels. Degenerative changes are noted in the left shoulder.
There is prosthetic metallic stent in the region of aortic valve.
Right lung is smaller than left. There is no focal consolidation.
There is no pleural effusion. Degenerative changes are noted with
bony spurs in the thoracic and lumbar spine. There is disc space
narrowing at L4-L5 and L5-S1 levels. There is edema in the
subcutaneous plane in the both lower legs. Arterial calcifications
are seen in the soft tissues.
IMPRESSION: No focal lytic lesions are seen.

Other findings as described in the body of the report.

## 2023-05-23 ENCOUNTER — Encounter: Payer: Self-pay | Admitting: Internal Medicine

## 2023-05-23 ENCOUNTER — Ambulatory Visit: Payer: Medicare Other | Attending: Internal Medicine | Admitting: Internal Medicine

## 2023-05-23 VITALS — BP 104/58 | HR 92 | Ht 67.0 in | Wt 211.8 lb

## 2023-05-23 DIAGNOSIS — Z952 Presence of prosthetic heart valve: Secondary | ICD-10-CM | POA: Insufficient documentation

## 2023-05-23 DIAGNOSIS — Z136 Encounter for screening for cardiovascular disorders: Secondary | ICD-10-CM | POA: Diagnosis not present

## 2023-05-23 DIAGNOSIS — I2510 Atherosclerotic heart disease of native coronary artery without angina pectoris: Secondary | ICD-10-CM | POA: Insufficient documentation

## 2023-05-23 DIAGNOSIS — E785 Hyperlipidemia, unspecified: Secondary | ICD-10-CM | POA: Insufficient documentation

## 2023-05-23 DIAGNOSIS — E7849 Other hyperlipidemia: Secondary | ICD-10-CM | POA: Diagnosis not present

## 2023-05-23 MED ORDER — FENOFIBRATE 145 MG PO TABS: 145.0000 mg | ORAL_TABLET | Freq: Every day | ORAL | 3 refills | Status: AC

## 2023-05-23 NOTE — Patient Instructions (Addendum)
 Medication Instructions:  Your physician has recommended you make the following change in your medication:  Decrease Fenofibrate  from 160 mg to 145 mg daily Continue taking all other medications as prescribed   Labwork: Requested labs from primary care physician   Testing/Procedures: Your physician has requested that you have an echocardiogram. Echocardiography is a painless test that uses sound waves to create images of your heart. It provides your doctor with information about the size and shape of your heart and how well your heart's chambers and valves are working. This procedure takes approximately one hour. There are no restrictions for this procedure. Please do NOT wear cologne, perfume, aftershave, or lotions (deodorant is allowed). Please arrive 15 minutes prior to your appointment time.  Please note: We ask at that you not bring children with you during ultrasound (echo/ vascular) testing. Due to room size and safety concerns, children are not allowed in the ultrasound rooms during exams. Our front office staff cannot provide observation of children in our lobby area while testing is being conducted. An adult accompanying a patient to their appointment will only be allowed in the ultrasound room at the discretion of the ultrasound technician under special circumstances. We apologize for any inconvenience.   Follow-Up: Your physician recommends that you schedule a follow-up appointment in: 6 months  Any Other Special Instructions Will Be Listed Below (If Applicable). Thank you for choosing [facility]!     If you need a refill on your cardiac medications before your next appointment, please call your pharmacy.

## 2023-05-23 NOTE — Progress Notes (Signed)
 Cardiology Office Note  Date: 05/23/2023   ID: Jeremy Stephens, DOB [birth year], MRN [id number]  PCP:  Almeda Loa ORN, FNP  Cardiologist:  Diannah SHAUNNA Maywood, MD Electrophysiologist:  None   History of Present Illness: Jeremy Stephens is a 78 y.o. 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  Overall doing great, no symptoms.  No angina, DOE, orthopnea, PND, leg swelling, dizziness, syncope, palpitations.  Past Medical History:  Diagnosis Date   Diabetes mellitus without complication (HCC)    Hyperlipidemia     Past Surgical History:  Procedure Laterality Date   CARDIAC VALVE REPLACEMENT     [place]   CHOLECYSTECTOMY     [place]    Current Outpatient Medications  Medication Sig Dispense Refill   amoxicillin (AMOXIL) 500 MG capsule SMARTSIG:4 Capsule(s) By Mouth Once     aspirin 81 MG EC tablet Take 81 mg by mouth daily.     atorvastatin (LIPITOR) 40 MG tablet  See Instructions, TAKE ONE TABLET BY MOUTH AT BEDTIME, # 90 tab, 1 Refill(s), Pharmacy: [facility]     Cholecalciferol (VITAMIN D3) 1.25 MG (50000 UT) CAPS Take 1 capsule by mouth once a week. Takes on Saturday     Coenzyme Q10 100 MG capsule Take 100 mg by mouth daily.     fenofibrate  160 MG tablet Take 160 mg by mouth daily.     furosemide (LASIX) 40 MG tablet Take 40 mg by mouth 2 (two) times daily.     gabapentin (NEURONTIN) 300 MG capsule Take 300 mg by mouth 3 (three) times daily.     glipiZIDE (GLUCOTROL) 5 MG tablet Take 5 mg by mouth 3 (three) times daily.     Krill Oil 500 MG CAPS Take 1 tablet by mouth daily.     losartan (COZAAR) 50 MG tablet Take 50 mg by mouth at bedtime.     metoprolol succinate (TOPROL-XL) 25 MG 24 hr tablet Take 25 mg by mouth 2 (two) times daily.      mirtazapine (REMERON) 15 MG tablet  See Instructions, TAKE ONE TABLET BY MOUTH ONCE A DAY AT BEDTIME TO HELP WITH SLEEP AND MOOD, # 90 tab, 1 Refill(s), Pharmacy: [facility]     Multiple Vitamin (MULTIVITAMIN WITH MINERALS) TABS tablet Take 1 tablet by mouth daily.     No current facility-administered medications for this visit.   Allergies:  Niaspan [niacin er (antihyperlipidemic)] and Sulfa antibiotics   Social History: The patient  reports that he has never smoked. He has never used smokeless tobacco. He reports that he does not currently use alcohol. He reports that he does not currently use drugs.   Family History: The patient's family history includes COPD in his brother; Heart attack in his brother; Heart attack (age of onset: 10) in his father; Multiple myeloma (age of onset: 86) in his mother; Stomach cancer in his paternal uncle.   ROS:  Please see the history of present illness. Otherwise, complete review of systems is positive for none  All other systems are reviewed and negative.   Physical Exam: VS:  Ht 5' 7 (1.702 m)   Wt 211 lb 12.8 oz (96.1 kg)   BMI 33.17 kg/m , BMI Body mass index is 33.17 kg/m.  Wt Readings from Last  3 Encounters:  05/23/23 211 lb 12.8 oz (96.1 kg)  09/27/21 220 lb 14.4 oz (100.2 kg)  05/17/21 218 lb 7.6 oz (99.1 kg)    General: Patient appears comfortable at rest. HEENT: Conjunctiva and lids normal, oropharynx clear with moist mucosa. Neck: Supple, no elevated JVP or carotid bruits, no thyromegaly. Lungs: Clear to auscultation, nonlabored breathing at rest. Cardiac: Regular rate and rhythm, no S3 or significant systolic murmur, no pericardial rub. Abdomen: Soft, nontender, no hepatomegaly, bowel sounds present, no guarding or rebound. Extremities: No pitting edema, distal pulses 2+. Skin: Warm and dry. Musculoskeletal: No kyphosis. Neuropsychiatric: Alert and oriented x3, affect grossly appropriate.  Recent Labwork: No results found  for requested labs within last 365 days.  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  Obtain lipid panel with the PCP.  He has bioprosthetic aortic valve in position.  Obtain echocardiogram.  Dental cleanings twice per year and antibiotic prior to dental procedures recommended. -Recently started on insulin for DM2 management.  Follows with PCP.     Medication Adjustments/Labs and Tests Ordered: Current medicines are reviewed at length with the patient today.  Concerns regarding medicines are outlined above.    Disposition:  Follow up  6 months  Signed Stephanie Littman Priya Floy Riegler, MD, 05/23/2023 3:00 PM    [facility] [address] [place], [place], [place] [address]

## 2023-06-01 ENCOUNTER — Ambulatory Visit: Payer: Medicare Other | Attending: Internal Medicine

## 2023-06-01 DIAGNOSIS — Z952 Presence of prosthetic heart valve: Secondary | ICD-10-CM

## 2023-06-02 ENCOUNTER — Telehealth: Payer: Self-pay

## 2023-06-02 LAB — ECHOCARDIOGRAM COMPLETE
AR max vel: 2.18 cm2
AV Area VTI: 2.4 cm2
AV Area mean vel: 2.26 cm2
AV Mean grad: 6.8 mm[Hg]
AV Peak grad: 12.5 mm[Hg]
Ao pk vel: 1.77 m/s
Area-P 1/2: 2.04 cm2
Calc EF: 61.9 %
MV VTI: 1.68 cm2
S' Lateral: 2.7 cm
Single Plane A2C EF: 59.4 %
Single Plane A4C EF: 63.6 %

## 2023-06-02 NOTE — Telephone Encounter (Signed)
The patient has been notified of the result and verbalized understanding.  All questions (if any) were answered. Jeremy Stephens, [place] 06/02/2023 4:27 PM

## 2023-06-02 NOTE — Telephone Encounter (Signed)
-----   Message from Vishnu P Mallipeddi sent at 06/02/2023  2:24 PM EST ----- Normal LVEF, normal aortic prosthesis. Mild to moderate MS, will monitor. Asymptomatic.

## 2024-03-26 ENCOUNTER — Telehealth: Payer: Self-pay | Admitting: Internal Medicine

## 2024-03-26 NOTE — Telephone Encounter (Signed)
 Will update the requesting office to see notes from preop APP Rosabel Mose, NP.

## 2024-03-26 NOTE — Telephone Encounter (Signed)
   Pre-operative Risk Assessment    Patient Name: Jeremy Stephens  DOB: Mar 19, 1946 MRN: [id number]   Date of last office visit: 05/23/23 Date of next office visit: 06/07/24  Request for Surgical Clearance    Procedure:  Dental Extraction - Amount of Teeth to be Pulled:  2  Date of Surgery:  Clearance TBD                                Surgeon:  Dr. Kayla Socks Group or Practice Name:  [facility]  Phone number:  [phone] Fax number:  [phone]   Type of Clearance Requested:   - Medical  - Pharmacy:  Hold        Type of Anesthesia:  Local    Additional requests/questions:     SignedBarbee DELENA Sharps   03/26/2024, 2:59 PM

## 2024-03-26 NOTE — Telephone Encounter (Signed)
    Primary Cardiologist: Vishnu P Mallipeddi, MD  Chart reviewed as part of pre-operative protocol coverage. Simple dental extractions are considered low risk procedures per guidelines and generally do not require any specific cardiac clearance. It is also generally accepted that for simple extractions and dental cleanings, there is no need to interrupt blood thinner therapy.   Patient with history of bovine pericardial bioprosthetic valve in the aortic position, SBE prophylaxis is required for the patient.   I will route this recommendation to the requesting party via Epic fax function and remove from pre-op pool.  Please call with questions.  Makynna Manocchio D Amoni Morales, NP 03/26/2024, 3:04 PM

## 2024-06-07 ENCOUNTER — Ambulatory Visit: Admitting: Internal Medicine
